# Patient Record
Sex: Male | Born: 1960 | Race: White | Hispanic: No | Marital: Married | Smoking: Never smoker
Health system: Southern US, Community
[De-identification: ages and names within clinical notes are randomized; demographics above are authoritative.]

## PROBLEM LIST (undated history)

## (undated) DIAGNOSIS — M5430 Sciatica, unspecified side: Secondary | ICD-10-CM

## (undated) DIAGNOSIS — I5022 Chronic systolic (congestive) heart failure: Secondary | ICD-10-CM

## (undated) DIAGNOSIS — I119 Hypertensive heart disease without heart failure: Secondary | ICD-10-CM

## (undated) DIAGNOSIS — E782 Mixed hyperlipidemia: Secondary | ICD-10-CM

## (undated) DIAGNOSIS — Z9289 Personal history of other medical treatment: Secondary | ICD-10-CM

## (undated) DIAGNOSIS — I110 Hypertensive heart disease with heart failure: Secondary | ICD-10-CM

## (undated) DIAGNOSIS — G8929 Other chronic pain: Secondary | ICD-10-CM

## (undated) DIAGNOSIS — I2510 Atherosclerotic heart disease of native coronary artery without angina pectoris: Secondary | ICD-10-CM

## (undated) DIAGNOSIS — IMO0002 Reserved for concepts with insufficient information to code with codable children: Secondary | ICD-10-CM

## (undated) DIAGNOSIS — K219 Gastro-esophageal reflux disease without esophagitis: Secondary | ICD-10-CM

## (undated) DIAGNOSIS — M419 Scoliosis, unspecified: Secondary | ICD-10-CM

## (undated) DIAGNOSIS — I200 Unstable angina: Secondary | ICD-10-CM

## (undated) DIAGNOSIS — M545 Low back pain, unspecified: Secondary | ICD-10-CM

## (undated) DIAGNOSIS — E669 Obesity, unspecified: Secondary | ICD-10-CM

## (undated) DIAGNOSIS — G4730 Sleep apnea, unspecified: Secondary | ICD-10-CM

## (undated) HISTORY — PX: LUMBAR DISC SURGERY: SHX700

## (undated) HISTORY — PX: BACK SURGERY: SHX140

## (undated) HISTORY — DX: Sleep apnea, unspecified: G47.30

## (undated) HISTORY — DX: Reserved for concepts with insufficient information to code with codable children: IMO0002

## (undated) HISTORY — DX: Obesity, unspecified: E66.9

## (undated) HISTORY — DX: Hypertensive heart disease without heart failure: I11.9

## (undated) HISTORY — DX: Mixed hyperlipidemia: E78.2

## (undated) HISTORY — DX: Chronic systolic (congestive) heart failure: I50.22

## (undated) HISTORY — PX: CORONARY ANGIOPLASTY WITH STENT PLACEMENT: SHX49

## (undated) HISTORY — DX: Scoliosis, unspecified: M41.9

## (undated) HISTORY — PX: TONSILLECTOMY: SUR1361

## (undated) HISTORY — DX: Hypertensive heart disease with heart failure: I11.0

## (undated) HISTORY — DX: Atherosclerotic heart disease of native coronary artery without angina pectoris: I25.10

## (undated) HISTORY — DX: Unstable angina: I20.0

---
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
## 1998-07-27 ENCOUNTER — Inpatient Hospital Stay (HOSPITAL_COMMUNITY): Admission: AD | Admit: 1998-07-27 | Discharge: 1998-07-29 | Payer: Self-pay | Admitting: Cardiology

## 1999-05-17 ENCOUNTER — Inpatient Hospital Stay (HOSPITAL_COMMUNITY): Admission: EM | Admit: 1999-05-17 | Discharge: 1999-05-19 | Payer: Self-pay | Admitting: *Deleted

## 2001-05-14 ENCOUNTER — Encounter: Admission: RE | Admit: 2001-05-14 | Discharge: 2001-05-14 | Payer: Self-pay | Admitting: Osteopathic Medicine

## 2001-05-14 ENCOUNTER — Encounter: Payer: Self-pay | Admitting: Osteopathic Medicine

## 2001-07-19 ENCOUNTER — Ambulatory Visit (HOSPITAL_COMMUNITY): Admission: RE | Admit: 2001-07-19 | Discharge: 2001-07-19 | Payer: Self-pay | Admitting: Osteopathic Medicine

## 2001-08-08 ENCOUNTER — Encounter: Payer: Self-pay | Admitting: Osteopathic Medicine

## 2001-08-08 ENCOUNTER — Encounter: Admission: RE | Admit: 2001-08-08 | Discharge: 2001-08-08 | Payer: Self-pay | Admitting: Osteopathic Medicine

## 2003-02-21 ENCOUNTER — Ambulatory Visit (HOSPITAL_COMMUNITY): Admission: RE | Admit: 2003-02-21 | Discharge: 2003-02-21 | Payer: Self-pay | Admitting: Cardiology

## 2003-02-21 ENCOUNTER — Inpatient Hospital Stay (HOSPITAL_COMMUNITY): Admission: EM | Admit: 2003-02-21 | Discharge: 2003-02-25 | Payer: Self-pay | Admitting: Cardiology

## 2003-12-16 ENCOUNTER — Ambulatory Visit: Payer: Self-pay | Admitting: Cardiology

## 2004-01-01 ENCOUNTER — Ambulatory Visit (HOSPITAL_BASED_OUTPATIENT_CLINIC_OR_DEPARTMENT_OTHER): Admission: RE | Admit: 2004-01-01 | Discharge: 2004-01-01 | Payer: Self-pay | Admitting: Cardiology

## 2004-01-01 ENCOUNTER — Ambulatory Visit: Payer: Self-pay | Admitting: Pulmonary Disease

## 2004-02-15 HISTORY — PX: CARDIAC CATHETERIZATION: SHX172

## 2004-02-20 ENCOUNTER — Ambulatory Visit: Payer: Self-pay | Admitting: Cardiology

## 2004-03-05 ENCOUNTER — Ambulatory Visit: Payer: Self-pay | Admitting: Internal Medicine

## 2004-03-15 ENCOUNTER — Ambulatory Visit: Payer: Self-pay | Admitting: Internal Medicine

## 2004-03-18 ENCOUNTER — Encounter: Payer: Self-pay | Admitting: Cardiology

## 2004-03-25 ENCOUNTER — Ambulatory Visit (HOSPITAL_BASED_OUTPATIENT_CLINIC_OR_DEPARTMENT_OTHER): Admission: RE | Admit: 2004-03-25 | Discharge: 2004-03-25 | Payer: Self-pay | Admitting: Internal Medicine

## 2004-04-01 ENCOUNTER — Ambulatory Visit: Payer: Self-pay | Admitting: Internal Medicine

## 2004-04-05 ENCOUNTER — Ambulatory Visit: Payer: Self-pay

## 2004-04-15 ENCOUNTER — Ambulatory Visit: Payer: Self-pay | Admitting: Internal Medicine

## 2004-04-22 ENCOUNTER — Ambulatory Visit: Payer: Self-pay | Admitting: Internal Medicine

## 2004-05-13 ENCOUNTER — Ambulatory Visit: Payer: Self-pay | Admitting: Internal Medicine

## 2004-05-31 ENCOUNTER — Ambulatory Visit: Payer: Self-pay | Admitting: Internal Medicine

## 2004-07-13 ENCOUNTER — Ambulatory Visit: Payer: Self-pay | Admitting: Cardiology

## 2004-09-28 ENCOUNTER — Ambulatory Visit: Payer: Self-pay | Admitting: Cardiology

## 2004-11-02 ENCOUNTER — Ambulatory Visit: Payer: Self-pay | Admitting: Cardiology

## 2005-04-22 ENCOUNTER — Inpatient Hospital Stay (HOSPITAL_COMMUNITY): Admission: AD | Admit: 2005-04-22 | Discharge: 2005-04-26 | Payer: Self-pay | Admitting: Cardiology

## 2005-04-22 ENCOUNTER — Ambulatory Visit: Payer: Self-pay | Admitting: Cardiology

## 2005-05-09 ENCOUNTER — Ambulatory Visit: Payer: Self-pay | Admitting: Internal Medicine

## 2005-08-05 ENCOUNTER — Ambulatory Visit: Payer: Self-pay | Admitting: Cardiology

## 2005-11-09 ENCOUNTER — Ambulatory Visit: Payer: Self-pay | Admitting: Cardiology

## 2006-01-26 ENCOUNTER — Ambulatory Visit: Payer: Self-pay | Admitting: Cardiology

## 2006-10-12 ENCOUNTER — Ambulatory Visit: Payer: Self-pay | Admitting: Cardiology

## 2007-12-28 ENCOUNTER — Ambulatory Visit: Payer: Self-pay | Admitting: Cardiology

## 2007-12-28 LAB — CONVERTED CEMR LAB
ALT: 18 units/L (ref 0–53)
AST: 21 units/L (ref 0–37)
Albumin: 3.8 g/dL (ref 3.5–5.2)
Alkaline Phosphatase: 43 units/L (ref 39–117)
Bilirubin, Direct: 0.1 mg/dL (ref 0.0–0.3)
Cholesterol: 183 mg/dL (ref 0–200)
HDL: 23.7 mg/dL — ABNORMAL LOW (ref >39.0)
LDL Cholesterol: 128 mg/dL — ABNORMAL HIGH (ref 0–99)
Total Bilirubin: 0.7 mg/dL (ref 0.3–1.2)
Total CHOL/HDL Ratio: 7.7
Total Protein: 7.2 g/dL (ref 6.0–8.3)
Triglycerides: 155 mg/dL — ABNORMAL HIGH (ref 0–149)
VLDL: 31 mg/dL (ref 0–40)

## 2008-10-14 ENCOUNTER — Encounter (INDEPENDENT_AMBULATORY_CARE_PROVIDER_SITE_OTHER): Payer: Self-pay | Admitting: *Deleted

## 2009-05-19 ENCOUNTER — Encounter: Payer: Self-pay | Admitting: Cardiology

## 2009-08-28 DIAGNOSIS — E785 Hyperlipidemia, unspecified: Secondary | ICD-10-CM

## 2009-08-28 DIAGNOSIS — E669 Obesity, unspecified: Secondary | ICD-10-CM

## 2009-08-28 DIAGNOSIS — I10 Essential (primary) hypertension: Secondary | ICD-10-CM

## 2009-08-28 HISTORY — DX: Obesity, unspecified: E66.9

## 2009-08-28 HISTORY — DX: Essential (primary) hypertension: I10

## 2009-08-28 HISTORY — DX: Hyperlipidemia, unspecified: E78.5

## 2009-09-02 ENCOUNTER — Ambulatory Visit: Payer: Self-pay | Admitting: Cardiology

## 2009-11-25 ENCOUNTER — Ambulatory Visit: Payer: Self-pay | Admitting: Cardiology

## 2010-03-09 ENCOUNTER — Encounter: Payer: Self-pay | Admitting: Cardiology

## 2010-03-16 NOTE — Assessment & Plan Note (Signed)
Summary: f1y  Medications Added CRESTOR 10 MG TABS (ROSUVASTATIN CALCIUM) 1 by mouth dialy CITALOPRAM HYDROBROMIDE 40 MG TABS (CITALOPRAM HYDROBROMIDE) 1 by mouth daily LANSOPRAZOLE 30 MG CPDR (LANSOPRAZOLE) 1 by mouth dialy NITROSTAT 0.4 MG SUBL (NITROGLYCERIN) as needed for chest pain PERCOCET 2.5-325 MG TABS (OXYCODONE-ACETAMINOPHEN) as needed      Allergies Added: NKDA  Visit Type:  Follow-up Primary Provider:  Dr. Gwendlyn Deutscher  CC:  CAD .  History of Present Illness: The patient presents for followup. It has been about a year and a half. Since I last saw him he has had no new cardiovascular complaints. He does do walking most days of the week. With this he does not get any chest pressure, neck or arm discomfort. He has no palpitations, presyncope or syncope. He is not describing any shortness of breath, PND or orthopnea. His lipids are followed by his primary care and he says he's not yet at target but is having his meds adjusted.  Current Medications (verified): 1)  Enalapril Maleate 20 Mg Tabs (Enalapril Maleate) .... One Tab Two Times A Day 2)  Plavix 75 Mg Tabs (Clopidogrel Bisulfate) .Marland Kitchen.. 1 Tab Once Daily 3)  Fenofibrate Micronized 200 Mg Caps (Fenofibrate Micronized) .Marland Kitchen.. 1 By Mouth Daily 4)  Crestor 10 Mg Tabs (Rosuvastatin Calcium) .Marland Kitchen.. 1 By Mouth Dialy 5)  Metoprolol Succinate 200 Mg Xr24h-Tab (Metoprolol Succinate) .Marland Kitchen.. 1 By Mouth Daily 6)  Citalopram Hydrobromide 40 Mg Tabs (Citalopram Hydrobromide) .Marland Kitchen.. 1 By Mouth Daily 7)  Lansoprazole 30 Mg Cpdr (Lansoprazole) .Marland Kitchen.. 1 By Mouth Dialy 8)  Nitrostat 0.4 Mg Subl (Nitroglycerin) .... As Needed For Chest Pain 9)  Percocet 2.5-325 Mg Tabs (Oxycodone-Acetaminophen) .... As Needed  Allergies (verified): No Known Drug Allergies  Past History:  Past Medical History: Reviewed history from 08/28/2009 and no changes required.  Coronary artery disease (previous severe   cardiomyopathy with remote myocardial infarction and  bare-metal stenting   of the right coronary artery in 2000.  Catheterization in 2007   demonstrated a 50-70% narrowing in the ostium of the diagonal off of the   LAD, 40% narrowing of the circumflex marginal and the proximal area the   previously placed stent and 0% stenosis in the stent.  He had a 95%   stenosis in the right coronary artery.  His EF was 55%.  He underwent   Cypher stenting in the stenosed area.), cardiomyopathy (the patient's   ejection fraction by most recent cath was 50%.  It had been 35-40% in   the past), hypertension, hyperlipidemia, mild sleep apnea not requiring   treatment, obesity, scoliosis.      Past Surgical History: Reviewed history from 08/28/2009 and no changes required.  1. Cardiac catheterization.  2. Back surgery x2.  Review of Systems       As stated in the HPI and negative for all other systems.   Vital Signs:  Patient profile:   50 year old male Height:      64 inches Weight:      175 pounds BMI:     30.15 Pulse rate:   62 / minute Resp:     18 per minute BP sitting:   122 / 86  (right arm)  Vitals Entered By: Marrion Coy, CNA (September 02, 2009 9:56 AM)  Nutrition Counseling: Patient's BMI is greater than 25 and therefore counseled on weight management options.  Physical Exam  General:  Well developed, well nourished, in no acute distress. Head:  normocephalic and  atraumatic Eyes:  PERRLA/EOM intact; conjunctiva and lids normal. Mouth:  poor dentition.  poor dentition.   Neck:  Neck supple, no JVD. No masses, thyromegaly or abnormal cervical nodes. Chest Wall:  no deformities or breast masses noted Lungs:  Clear bilaterally to auscultation and percussion. Abdomen:  Bowel sounds positive; abdomen soft and non-tender without masses, organomegaly, or hernias noted. No hepatosplenomegaly. Msk:  Back normal, normal gait. Muscle strength and tone normal. Extremities:  No clubbing or cyanosis. Neurologic:  Alert and oriented x 3. Skin:   Intact without lesions or rashes. Cervical Nodes:  no significant adenopathy Psych:  Normal affect.   Detailed Cardiovascular Exam  Neck    Carotids: Carotids full and equal bilaterally without bruits.      Neck Veins: Normal, no JVD.    Heart    Inspection: no deformities or lifts noted.      Palpation: normal PMI with no thrills palpable.      Auscultation: regular rate and rhythm, S1, S2 without murmurs, rubs, gallops, or clicks.    Vascular    Abdominal Aorta: no palpable masses, pulsations, or audible bruits.      Femoral Pulses: normal femoral pulses bilaterally.      Pedal Pulses: normal pedal pulses bilaterally.      Radial Pulses: normal radial pulses bilaterally.      Peripheral Circulation: no clubbing, cyanosis, or edema noted with normal capillary refill.     EKG  Procedure date:  09/02/2009  Findings:      Sinus rhythm, rate 62, axis within normal limits, intervals within normal limits, nonspecific lateral T wave flattening  Impression & Recommendations:  Problem # 1:  CAD (ICD-414.00) It has been 4 years since his last catheterization. According to appropriateness guidelines it is time for an exercise treadmill test. This will allow me to rule out obstructive disease, risk stratify and give him a prescription for exercise. Orders: EKG w/ Interpretation (93000) Treadmill (Treadmill)  Problem # 2:  OBESITY (ICD-278.00) We discussed weight loss and diet strategies.  Problem # 3:  HYPERTENSION (ICD-401.9) His blood pressure is controlled and he will continue as above.  Problem # 4:  HYPERLIPIDEMIA (ICD-272.4) Lipids are being followed with his primary care. The goal would be LDL less than 100 and HDL greater than 40. An aggressive goal given his young age would be appropriate with an LDL less than 70.  Patient Instructions: 1)  Your physician recommends that you schedule a follow-up appointment at time of treadmill 2)  Your physician recommends that you  continue on your current medications as directed. Please refer to the Current Medication list given to you today. 3)  Your physician has requested that you have an exercise tolerance test.  For further information please visit [email or web address]  Please also follow instruction sheet, as given.

## 2010-03-16 NOTE — Letter (Signed)
Summary: [facility] Office Note  [facility] Office Note   Imported By: Roderic Ovens 05/29/2009 15:05:56  _____________________________________________________________________  External Attachment:    Type:   Image     Comment:   External Document

## 2010-03-23 ENCOUNTER — Encounter: Payer: Self-pay | Admitting: Cardiology

## 2010-04-01 NOTE — Miscellaneous (Signed)
Summary: crestor 40 mg   Clinical Lists Changes  Medications: Changed medication from CRESTOR 10 MG TABS (ROSUVASTATIN CALCIUM) 1 by mouth dialy to CRESTOR 40 MG TABS (ROSUVASTATIN CALCIUM) one daily - Signed Rx of CRESTOR 40 MG TABS (ROSUVASTATIN CALCIUM) one daily;  #30 x 11;  Signed;  Entered by: Charolotte Capuchin, RN;  Authorized by: Rollene Rotunda, MD, [facility];  Method used: Electronically to [facility]  [id number] [place], [place]  [address], Ph: [phone] or [phone], Fax: [phone]    Prescriptions: CRESTOR 40 MG TABS (ROSUVASTATIN CALCIUM) one daily  #30 x 11   Entered by:   Charolotte Capuchin, RN   Authorized by:   Rollene Rotunda, MD, [facility]   Signed by:   Charolotte Capuchin, RN on 03/23/2010   Method used:   [facility] to        [facility]  [id number] (retail)       [address]       [place], [place]  [address]       Ph: [phone] or [phone]       Fax: [phone]   RxID:   [id number]

## 2010-05-28 ENCOUNTER — Encounter: Payer: Self-pay | Admitting: Cardiology

## 2010-05-28 ENCOUNTER — Other Ambulatory Visit: Payer: Self-pay | Admitting: Nurse Practitioner

## 2010-05-28 DIAGNOSIS — I2510 Atherosclerotic heart disease of native coronary artery without angina pectoris: Secondary | ICD-10-CM

## 2010-06-29 NOTE — Assessment & Plan Note (Signed)
[facility]                            CARDIOLOGY OFFICE NOTE   NAME:[name], [name]                          MRN:          [id number]  DATE:10/12/2006                            DOB:          09/01/1960    PRIMARY:  Dr. Gwendlyn Deutscher.   REASON FOR PRESENTATION:  Evaluate patient with coronary disease.   HISTORY OF PRESENT ILLNESS:  The patient is a 50 year old   INCOMPLETE DICTATION.     Rollene Rotunda, MD, [facility]     JH/MedQ  DD: 10/12/2006  DT: 10/13/2006  Job #: [id number]

## 2010-06-29 NOTE — Assessment & Plan Note (Signed)
[facility]                            CARDIOLOGY OFFICE NOTE   NAME:[name] [name], [name] [name]                          MRN:          [id number]  DATE:10/12/2006                            DOB:          12-04-1960    PRIMARY CARE PHYSICIAN:  Gwendlyn Deutscher, M.D.   REASON FOR PRESENTATION:  Evaluate patient with coronary disease.   HISTORY OF PRESENT ILLNESS:  The patient returns for followup of the  above. He has done relatively well since I last saw him. He has had no  further chest discomfort since his angioplasty in 2007. He denies any  neck or arm discomfort. He has had no palpitation, pre-syncope, or  syncope. He has no shortness of breath, PND, or orthopnea. He has not  been exercising as routinely as I would like, though he does do some  things such as carrying items up a hill and paddling a canoe, which does  not bring on any of his symptoms. He says he is still having some  difficulty sleeping.   PAST MEDICAL HISTORY:  1. Coronary artery disease (previous severe cardiomyopathy with remote      myocardial infarction and [employer] metal stenting to the right coronary      artery in 2000.) Repeat catheterization in 2007 demonstrated a 50%      to 70% narrowing in the ostium of the diagonal of the LAD, 40%      narrowing of the circumflex marginal at the proximal area of the      previously placed stent and O% stenosis in the stent. The patient      had 95% stenosis in the right coronary artery. He had an ejection      fraction of 55%. He underwent Cypher stenting to the stenosed area.  2. Hypertension.  3. Hyperlipidemia.  4. Sleep apnea.  5. Scoliosis.   ALLERGIES:  Fenofibrate  NO KNOWN DRUG ALLERGIES.   CURRENT MEDICATIONS:  Toprol 200 mg daily, Enalapril 20 mg b.i.d., Lasix  20 mg daily, Prevacid 30 mg daily, potassium 30 meq b.i.d., Plavix 75 mg  daily, Fenofibrate 54 mg daily, Lipitor 40 mg q.h.s., aspirin 162 mg  daily.   REVIEW OF SYSTEMS:  As  stated in the HPI and otherwise, negative for  other systems.   PHYSICAL EXAMINATION:  GENERAL:  The patient is in no distress.  VITAL SIGNS:  Blood pressure 128/86, heart rate 81 and regular.  HEENT:  Eyelids unremarkable. Pupils are equal, round, and reactive to  light. Fundi visualized. Oral mucosa unremarkable.  NECK:  No jugular venous distention. Wave form within normal limits.  Carotid upstroke brisk and symmetric. No bruits, no thyromegaly.  LYMPH:  No cervical, axillary, inguinal adenopathy.  LUNGS:  Clear to auscultation bilaterally.  BACK:  No costovertebral angle tenderness.  CHEST:  Unremarkable.  HEART:  PMI not displaced or sustained. S1 and S2 within normal limits.  No S3, S4, click, rubs, or murmurs.  ABDOMEN:  Obese. Positive bowel sounds. Normal in frequency and pitch.  No bruits, rebound, guarding, midline pulsatile mass, or  hepatosplenomegaly.  SKIN:  No rashes.  EXTREMITIES:  2+ pulses. No edema.   LABORATORY DATA:  EKG:  Sinus rhythm. Rate 81, axis within normal  limits. Intervals within normal limits. No acute STT wave changes.   ASSESSMENT/PLAN:  1. Coronary disease. The patient is having no symptoms related to      this. No further cardiovascular testing is suggested. I did suggest      that he needs more exercise for secondary risk reduction.  2. Insomnia. The patient is bothered by this. He is not wearing his      CPAP. I told him that he needs to go back and see Dr. Maple Hudson. I did      go ahead and give him 5 mg of Ambien for 2 weeks. If he needs      anymore of this, I am going to ask him to have discussion with his      primary care physician.  3. Hypertension. Blood pressure is currently well controlled. He will      continue medications as listed.  4. Obesity. He needs to lose his weight through diet and exercise and      we discussed this.  5. Dyslipidemia. There was some difficulty in his pharmacy getting      Fenofibrate 160 mg. Therefore, I am  going to write for the 200 mg      Fenofibrate. He is going to remain on Lipitor 40. He needs to get a      liver profile and lipids done in about 8 weeks.     Rollene Rotunda, MD, [facility]  Electronically Signed    JH/MedQ  DD: 10/13/2006  DT: 10/14/2006  Job #: [id number]   cc:   Durenda Hurt, M.D.

## 2010-06-29 NOTE — Assessment & Plan Note (Signed)
[facility]                            CARDIOLOGY OFFICE NOTE   NAME:[name] [name], [name] [name]                          MRN:          [id number]  DATE:12/28/2007                            DOB:          February 21, 1960    PRIMARY CARE PHYSICIAN:  Gwendlyn Deutscher, MD   REASON FOR PRESENTATION:  Evaluate the patient with coronary artery  disease.   HISTORY OF PRESENT ILLNESS:  The patient returns for yearly followup  with his coronary artery disease and cardiomyopathy.  Since I last saw  him, he has done well from a cardiovascular standpoint.  He has had no  chest discomfort, neck, or arm discomfort.  She has had no palpitations,  presyncope, or syncope.  He tries to exercise a couple times a week, but  he is not very religious about this.  He actually has gained weight.  He  has not had his lipids checked in a while when he comes fasting today.  He is taking the medications as listed.   PAST MEDICAL HISTORY:  Coronary artery disease (previous severe  cardiomyopathy with remote myocardial infarction and bare-metal stenting  of the right coronary artery in 2000.  Catheterization in 2007  demonstrated a 50-70% narrowing in the ostium of the diagonal off of the  LAD, 40% narrowing of the circumflex marginal and the proximal area the  previously placed stent and 0% stenosis in the stent.  He had a 95%  stenosis in the right coronary artery.  His EF was 55%.  He underwent  Cypher stenting in the stenosed area.), cardiomyopathy (the patient's  ejection fraction by most recent cath was 50%.  It had been 35-40% in  the past), hypertension, hyperlipidemia, mild sleep apnea not requiring  treatment, obesity, scoliosis.   MEDICATIONS:  1. Toprol 200 mg daily.  2. Enalapril 20 mg b.i.d.  3. Lasix 20 mg daily.  4. Prevacid 30 mg daily.  5. Potassium 20 mEq b.i.d.  6. Plavix 75 mg daily.  7. Fenofibrate 200 mg daily.  8. Lipitor 40 mg daily.  9. Aspirin 162 mg daily.   REVIEW  OF SYSTEMS:  As stated in the HPI and otherwise negative for  other systems.   PHYSICAL EXAMINATION:  GENERAL:  The patient is pleasant and in no  distress.  VITAL SIGNS:  Blood pressure 116/82, heart rate 59 and regular, weight  171 pounds.  HEENT:  Eyelids are unremarkable.  Pupils are equal, round, and reactive  to light.  Fundi not visualized.  Oral mucosa unremarkable.  NECK:  No jugular venous distention to 45 degrees.  Carotid upstroke  brisk and symmetrical.  No bruits.  No thyromegaly.  LYMPHATICS:  No cervical, axillary, or inguinal adenopathy.  LUNGS:  Clear to auscultation bilaterally.  BACK:  No costovertebral angle tenderness.  CHEST:  Unremarkable.  HEART:  PMI not displaced or sustained.  S1 and S2 within normal limits.  No S3.  No S4.  No clicks.  No rubs.  No murmurs.  ABDOMEN:  Obese.  Positive bowel sounds normal in frequency and pitch.  No bruits.  No rebound.  No guarding.  No midline pulsatile mass.  No  hepatomegaly.  No splenomegaly.  SKIN:  No rashes.  No nodules.  EXTREMITIES:  Pulse 2+ throughout.  No edema, no cyanosis, no clubbing.  NEUROLOGIC:  Oriented to person, place, and time.  Cranial nerves II-XII  grossly intact.  Motor grossly intact.   EKG sinus rhythm, rate 59, axis within normal limits, intervals within  normal limits, no acute ST-wave changes.   ASSESSMENT AND PLAN:  1. Coronary artery disease.  The patient is having no new symptoms.      No further cardiovascular testing is suggested.  We are going to      continue aggressive risk reduction.  2. Dyslipidemia.  We will check a lipid profile today and manages as      indicated.  3. Obesity.  We discussed the need to lose weight with diet and      exercise.  He is not exercising enough and I gave him some specific      instructions on this.  4. Cardiomyopathy.  His ejection fraction had improved and I have no      reason to reevaluate.  He will continue on meds as listed.  5. Followup.   We will see him back in 1 year or sooner if needed.     Rollene Rotunda, MD, [facility]  Electronically Signed    JH/MedQ  DD: 12/28/2007  DT: 12/28/2007  Job #: [id number]   cc:   Durenda Hurt, M.D.

## 2010-07-02 NOTE — H&P (Signed)
[name] [name], [name] NO.:  [id number]   MEDICAL RECORD NO.:  [id number]                   PATIENT TYPE:  INP   LOCATION:  2922                                 FACILITY:  [facility]   PHYSICIAN:  Rollene Rotunda, M.D.                DATE OF BIRTH:  [birth year]   DATE OF ADMISSION:  02/21/2003  DATE OF DISCHARGE:                                HISTORY & PHYSICAL   PRIMARY PHYSICIAN:  Dr. Jeanie Sewer in [place], [place].   PRIMARY CARDIOLOGIST:  Dr. Chales Abrahams.   CHIEF COMPLAINT:  Chest pain, shortness of breath.   HISTORY OF PRESENT ILLNESS:  [name] is a 50 year old male with known  coronary artery disease who went to [facility] at about 2 a.m. on the  day of admission for chest pain.  He was treated there with aspirin,  heparin, and IV fluids for decreased blood pressure in the 70s.  He also  received Integrilin.  The patient was felt to be acutely abnormal and was  transported to [facility] for further evaluation and urgent  catheterization.   [name] describes his pain as less than 5/10 at its worst, but he has a  near-syncopal episode as well as the chest pain, which was associated with  shortness of breath and nausea.  The near-syncopal episode is the main  reason he went to the emergency room.  He received IV Lasix at [facility], a  40 mg dose, and his shortness of breath improved.  He describes an  approximately two-week history of PND and orthopnea and denies lower  extremity edema.  He also states that he had a viral illness that was  resolving over the last two weeks as well.  He did not see his family  physician during this illness.   PAST MEDICAL HISTORY:  1. History of a stent to his RCA in June, 2000 and a stent to his OM in     April, 2001.  2. Hypertension.  3. Hyperlipidemia.  4. Obesity.  5. Family history of coronary artery disease.  6. He denies any history of tobacco use.  7. He has a history of  gastroesophageal reflux disease symptoms.   PAST SURGICAL HISTORY:  1. Cardiac catheterization.  2. Back surgery x2.   ALLERGIES:  No known drug allergies.   MEDICATIONS PRIOR TO ADMISSION:  1. Aspirin 325 mg q.d.  2. Norvasc 5 mg q.d.  3. Toprol XL, unclear dose, probably 25 mg q.d.  4. Altace 2.5 mg q.d.  5. Lipitor 40 mg q.d.  6. ___________ p.r.n.   SOCIAL HISTORY:  Lives in [place] with his wife.  Works at [employer].  He has never smoked.  He drinks beer, but he states that he has no more than  two six weeks per week and some weeks does not drink at all.  He denies drug  abuse.   FAMILY HISTORY:  Significant for his mother is alive without any history of  coronary artery disease, but his father had the first MI at age 61, which  made him bedridden, although he did not die until age 71.  He has no  siblings with coronary artery disease.   REVIEW OF SYSTEMS:  Significant for a viral illness that he states was  resolving over the last 3-4 weeks, for which he did not see his primary care  physician.  He had a presyncopal episode today as well as orthopnea and PND  but denies any edema or palpitations.  He has arthralgias with pain that is  chronic to his back and denies any melena, hematemesis, hemoptysis, or  reflux symptoms.  Review of systems is otherwise negative.   PHYSICAL EXAMINATION:  VITAL SIGNS:  He is afebrile.  Blood pressure 113/56.  His heart rate is 106 and regular, sinus tachycardia on the monitor.  His  respiratory rate is 22 and not labored.  GENERAL:  He is a well-developed and well-nourished white male in no acute  distress.  HEENT:  Head is normocephalic and atraumatic.  Pupils are equal, round and  reactive to light and accommodation.  Nares without discharge.  NECK:  Supple without lymphadenopathy, thyromegaly, bruits, or JVD.  CV:  Heart is regular in rate and rhythm with an S1 and S2 and a 2-3/6  systolic ejection murmur at the left sternal  border.  LUNGS:  A few rales in the bases, but no crackles are appreciated.  SKIN:  No rashes or lesions are noted.  ABDOMEN:  Soft and nontender with active bowel sounds.  No  hepatosplenomegaly noted.  EXTREMITIES:  There is no clubbing, cyanosis or edema noted.  MUSCULOSKELETAL:  He has no joint deformity or effusions.  No spinal or CVA  tenderness.  NEUROLOGIC:  He is alert and oriented x 3 with cranial nerves II-XII are  grossly intact.   CHEST X-RAY:  Results pending.   EKG:  Sinus rhythm without acute ischemic changes.   LABORATORY VALUES:  Hemoglobin 15.1, hematocrit 44.6, WBC 9.8, platelets  283.  Sodium 139, potassium 3.8, chloride 100, CO2 27, BUN 19, creatinine  1.2, glucose 179.  CK total 58.  Troponin I 0.1.  INR 1.0.  PTT 28.   ASSESSMENT/PLAN:  1. Chest pain with hypotension treated with intravenous fluids at [facility].  Urgent catheterization, which he had today, showed no critical     disease and no significant end-stent restenosis, so PCI was not needed.  2. 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  His     ejection fraction is 10%, which is significantly decreased from an     ejection fraction of 50% at a Cardiolite performed in July, 2003.  He has     global hypokinesis.  The MD is to evaluate and advise if this is possibly     viral in origin with his recent illness and also when he should have a     follow-up echo.  3. Congestive heart failure:  He had good urine output after IV Lasix.  His     respiratory status improved, and his oxygen saturation is good on 2     liters.  We will follow him for this and continue dobutamine for now and  possibly discontinuing the dobutamine tomorrow if he continues to do     well.  4. Hyperlipidemia:  Check lipids in the a.m., as he has not had a recent     check. 5. History of gastroesophageal reflux disease  symptoms, which he now denies.     Besides continuing him on aspirin, will add a proton pump inhibitor to     his medication regimen.  6. Hyperglycemia with a blood sugar of 179 on labs performed on [facility].  Will check a hemoglobin A1C and follow CBGs as well.   Dr. Antoine Poche and I evaluated the patient and felt that dobutamine was  indicated and that he might tolerate a slight volume for BP, but the EDP is  very high.  Hopefully, we will be able to restart his ACE inhibitor before  discharge.  There is possible role for transfer to transplant center, as he  has class II symptoms.   Dr. Antoine Poche and Dr. Charlies Constable both saw the patient and determined the  plan of care.      Theodore Demark, P.A. [facility]                  Rollene Rotunda, M.D.    RB/MEDQ  D:  02/21/2003  T:  02/21/2003  Job:  [id number]

## 2010-07-02 NOTE — Discharge Summary (Signed)
NAMEGENARO, BEKKER NO.:  [id number]   MEDICAL RECORD NO.:  [id number]                   PATIENT TYPE:  INP   LOCATION:  2005                                 FACILITY:  [facility]   PHYSICIAN:  Rollene Rotunda, M.D.                DATE OF BIRTH:  03-27-1960   DATE OF ADMISSION:  02/21/2003  DATE OF DISCHARGE:  02/25/2003                           DISCHARGE SUMMARY - REFERRING   PROCEDURES:  Cardiac catheterization, February 21, 2003.   REASON FOR ADMISSION:  Please refer to dictated admission note.   LABORATORY DATA:  Sodium 137, potassium 4.2, glucose 116, BUN 18, creatinine  1.1 at discharge.  BNP 388 at discharge.  Serial cardiac markers:  Normal  CPK/MB; marginally elevated troponin I (peak 0.11).  Lipid profile:  Total  cholesterol 327, triglycerides 224, HDL 38, LDL 144 (cholesterol/HDL ratio  6.0).  Hemoglobin A1c 5.5.   HOSPITAL COURSE:  Following transfer from [facility], where the  patient initially presented with presyncope, the patient was taken directly  to the catheterization lab for emergent coronary angiography.  He was  hypotensive on presentation with systolic reading in the 80s.   Coronary angiogram, however, revealed severe, acute nonischemic  cardiomyopathy with global hypokinesis and estimated ejection fraction of  10%.   Of note, there was no in-stent restenosis at the previous stent/CFX site and  less than 20% in-stent restenosis of the proximal RCA.  Residual notable for  a 70% diagonal and a 40% mid, distal RCA.   Postop course notable for treatment of cardiomyopathy, initially with  dobutamine.  The patient was followed closely by Dr. Rollene Rotunda, who  felt that there was no role for Natrecor.  He also felt that the patient was  not suitable for cardiac transplant, given that he had class II symptoms.   The patient gradually progressed and remained stable on oral medications.  He continued to report no further  chest pain or shortness of breath and was  felt to be compensated on hospital day #4, and thus cleared for discharge.   Notable labs at time of discharge include potassium 4.2, creatinine 1.1, and  BNP level 388.   Of note, the patient will need a followup 2-D echocardiogram in 6 weeks.   Discharge weight 149.5 (155 on admission).   MEDICATIONS AT DISCHARGE:  1. Vasotec 2.5 mg b.i.d. (new).  2. Lasix 40 mg daily (new).  3. K-Dur 20 mEq daily (new).  4. Digoxin 0.25 mg daily (new).  5. Toprol-XL 50 mg daily.  6. Lipitor 40 mg nightly.  7. Baby aspirin 81 mg daily.   INSTRUCTIONS:  Refrain from any added salt.  Check daily weights.   The patient will need a followup BMET in 1 week at [facility].   The patient is scheduled to follow up with Dr. Veneda Melter on Wednesday,  March 05, 2003, at  4:15 p.m.  He will need a repeat 2-D echocardiogram in  6 weeks.   DISCHARGE DIAGNOSES:  1. Severe acute nonischemic cardiomyopathy.     a. Status post emergent coronary angiogram, February 21, 2003.     b. Severe left ventricular dysfunction (ejection fraction approximately        10% with global hypokinesis).     c. Status post non-ST myocardial infarction/stent, circumflex, 2001;        status post stent to right coronary artery, 2000.  2. History of hypertension.  3. Mixed dyslipidemia.  4. Hyperglycemia.     a. Normal hemoglobin A1c.  5. Hypokalemia.      Gene Serpe, P.A. [facility]                      Rollene Rotunda, M.D.    GS/MEDQ  D:  02/25/2003  T:  02/25/2003  Job:  [id number]   cc:   Durenda Hurt, M.D.  [address]  [place], [place] [address]  Fax: [phone]

## 2010-07-02 NOTE — Procedures (Signed)
[name] [name], [name] [name]                 ACCOUNT NO.:  [id number]   MEDICAL RECORD NO.:  [id number]          PATIENT TYPE:  OUT   LOCATION:  SLEEP CENTER                 FACILITY:  [facility]   PHYSICIAN:  Clinton D. Maple Hudson, M.D. DATE OF BIRTH:  May 04, 1960   DATE OF STUDY:  03/25/2004                              NOCTURNAL POLYSOMNOGRAM   STUDY DATE:  March 25, 2004   REFERRING PHYSICIAN:  Dr. Jetty Duhamel   INDICATION FOR STUDY:  Hypersomnia with obstructive sleep apnea.  Epworth  Sleepiness Score 4/24, BMI 27, weight 160 pounds.  Previous NPSG January 01, 2004 limited by short sleep time with RDI 4 per hour.   SLEEP ARCHITECTURE:  Total sleep time 469 minutes with sleep efficiency 97%.  Stage I was 2%, stage II was 57%, stages III and IV 24%, REM was 17% of  total sleep time.  Sleep latency 2 minutes, REM latency 160 minutes, awake  after sleep onset 11 minutes, arousal index 12.  No sleep medications taken.   RESPIRATORY DATA:  Split-study protocol.  Respiratory disturbance index  (RDI) 13.4 obstructive events per hour indicating mild obstructive sleep  apnea/hypopnea syndrome before CPAP.  This included 3 obstructive apneas and  38 hypopneas before CPAP.  Initial sleep and most events were supine.  REM  RDI 9.6 per hour.  CPAP was titrated to 6 CWP, RDI 0.9 per hour using a  small Respironics ComfortSelect Nasal Mask with heated humidifier which was  well tolerated.   OXYGEN DATA:  Moderate snoring with oxygen desaturation to a nadir of 86%  before CPAP.  After CPAP titration oxygen saturation held 95-98% on room  air.   CARDIAC DATA:  Normal sinus rhythm.   MOVEMENT/PARASOMNIA:  A total of 127 limb jerks were recorded of which 14  were associated with arousal or awakening for a periodic limb movement with  arousal index of 1.8 per hour which is slightly increased and probably  clinically insignificant.   IMPRESSION/RECOMMENDATION:  1.  Mild obstructive sleep apnea/hypopnea  syndrome, RDI 13.4 per hour with      oxygen desaturation to 86%.  2.  Successful continuous positive airway pressure titration to 6 CWP, RDI      0.9 per hour using a small Respironics      ComfortSelect Nasal Mask with heated humidifier.  3.  Mild periodic limb movement with arousal.      CDY/MEDQ  D:  04/04/2004 11:53:35  T:  04/04/2004 22:06:08  Job:  [id number]

## 2010-07-02 NOTE — Discharge Summary (Signed)
[place]  Patient:    [name] [name], [name] [name]                          MRN: [id number] Adm. Date:  05/17/99 Disc. Date: 05/19/99 Attending:  Noralyn Pick. Eden Emms, M.D. [facility] Dictator:   Rozell Searing, P.A. CC:         Gwendlyn Deutscher, M.D.                           Discharge Summary  PROCEDURES:  Cardiac catheterization/stent OM April 3.  REASON FOR ADMISSION:  Mr. Schreck is a 50 year old male patient of Dr. Nedra Hai with previous stent/RCA in June 2000 who presented with symptoms suggestive of unstable angina pectoris.  He was admitted for rule out of MI and further diagnostic evaluation.  LABORATORY DATA:  Cardiac Enzymes: Peak CPK 344/46 (13.4%); peak troponin I 1.02.  Lipid profile pending.  CBC normal at discharge.  Potassium 3.3 on admission, 3.5 at discharge.  Magnesium 2.0.  Normal renal function.  Admission Chest X-ray: No acute distress.  HOSPITAL COURSE:  Following admission, the patient ruled in for non-Q-wave myocardial infarction.  He was placed on Lovenox and plans were to proceed with definitive coronary angiogram to rule out restenosis of previous stent of the RCA.  Cardiac catheterization performed by Dr. Nedra Hai (see catheterization report) revealed no significant restenosis of prior stent at RCA site (approximately 20%) with subsequent 30% mid and distal lesion; no significant LAD disease; 50% ostial ______ ; 95% proximal OM-1 lesion.  Left ventriculogram: EF greater than 55%; no MR.  Dr. Chales Abrahams proceeded with successful PTCA and predilatation followed by stenting of the 95% OM-1 lesion to 0% residual stenosis.  No complications were noted, and patient was cleared for discharge the following day following completion of his Integrilin infusion.  Of note, potassium 3.5 at discharge, received supplemental dose.  The patient is on Diovan 160/HCTZ 12.5.  Medication Adjustments: Plavix 75 mg q.d (x 4 weeks).  DISCHARGE MEDICATIONS:  1. Plavix  75 mg q.d. (x 4 weeks).  2. Coated aspirin 325 mg q.d.  3. Lipitor 40 mg q.d.  4. Niaspan 2 g q.d.  5. Prevacid 30 mg q.d.  6. Norvasc 5 mg q.d.  7. Diovan 160/12.5 mg q.d.  8. Paxil 20 mg q.d.  9. Darvocet-N 100 as directed. 10. Nitrostat as directed.  INSTRUCTIONS:  The patient is to refrain from any heavy lifting, strenuous activity, or driving x 2 weeks.  He is to maintain low-fat/cholesterol diet. He is instructed to call our office if there is any bleeding/swelling in the groin.  The patient will follow up with Gene Serpe, P.A.-C. in two weeks.  He will then return for followup with Dr. Nedra Hai in approximately three months.  DISCHARGE DIAGNOSES:  1. Acute coronary syndrome/non-Q-wave myocardial infarction.     a. Percutaneous transluminal coronary angioplasty/stent obtuse margin #1        April 3.     b. A 20% restenosis of stent to right coronary artery site.     c. Normal left ventricular function.     d. Status post stent right coronary artery June 2000 (PRESTO).  2. Dyslipidemia.  3. Hypertension.  4. Gastroesophageal reflux disease.  5. Obesity.  6. hypokalemia.  7. History of sinus bradycardia/significant sinus pause. DD:  05/19/99 TD:  05/19/99 Job: [id number]

## 2010-07-02 NOTE — H&P (Signed)
NAMEPONCIANO, SHEALY NO.:  [id number]   MEDICAL RECORD NO.:  [id number]          PATIENT TYPE:  INP   LOCATION:  4730                         FACILITY:  [facility]   PHYSICIAN:  [name] [name], M.D.   DATE OF BIRTH:  1960/08/12   DATE OF ADMISSION:  04/22/2005  DATE OF DISCHARGE:                                HISTORY & PHYSICAL   PRIMARY CARE:  [name] [name]   CARDIOLOGY:  [name] [name]   PULMONARY:  [name] [name]   CHIEF COMPLAINT:  [name] [name] is a 50 year old Caucasian male with known  coronary artery disease, left ventricular dysfunction due to nonischemic  cardiomyopathy following a viral infection status post recent admission for  congestive heart failure in 2005. [name] [name] presents to the congestive heart  failure clinic on day of admission at [facility] complaining of  exertional angina. [name] [name] has onset approximately 1 week ago with  increased episodes of exertional angina relieved with rest. [name] [name] has  known history of coronary artery disease status post stenting in 2000 for  right coronary artery for a 98% proximal stenosis. The patient has recurrent  chest pain in 2001, was found to have a 95% obtuse marginal stenosis which  was treated with a stent. He also has a known history of hypertension,  recorded ejection fraction of 35%. He also has a history of obstructive  sleep apnea with recent intolerance to home CPAP.   PAST MEDICAL HISTORY:  1.  Coronary artery disease, interventions as stated above.  2.  Obstructive sleep apnea with home CPAP, followed by [name] [name].  3.  Nonischemic cardiomyopathy with an EF of 35%.  4.  Hypertension.  5.  Scoliosis.  6.  Class II heart failure.   MEDICATIONS AT THIS TIME:  1.  Aspirin 325 mg daily.  2.  Lipitor 80 mg daily.  3.  Toprol-XL 200 mg daily.  4.  Enalapril 20 mg p.o. b.i.d.  5.  Lasix 20 mg daily.  6.  Prevacid 30 mg daily.  7.  Aspirin 325 mg daily.  8.  Potassium 20 mEq p.o.  b.i.d.   ALLERGIES:  No known drug allergies.   SOCIAL HISTORY:  The patient lives in [place] with his wife. He has two  sons. He denies any current EtOH, drug, or herbal medication use. Denies any  tobacco use, he has never smoked. He quit using alcohol approximately 1 year  ago.   FAMILY HISTORY:  Father had a history of obstructive sleep apnea, died of  heart disease status post ventricular fibrillation arrest with a history of  alcoholism. Mother with no known CAD.   REVIEW OF SYSTEMS:  Positive for chest pain. Denies any orthopnea, PND, or  any symptoms of congestive heart failure. Negative for any hematuria,  melena. Positive for insomnia and intolerance to CPAP over the last few  nights. Denies any palpitations or syncopal episodes.   PHYSICAL EXAMINATION:  VITAL SIGNS:  Current weight 159, blood pressure  121/79, heart rate 76 and regular.  HEENT:  Pupils equal, round, and reactive to light. Sclerae  are clear.  NECK:  Supple without lymphadenopathy. Negative bruit or JVD.  CARDIOVASCULAR:  Heart rate regular rhythm, S1, S2, without murmurs, rubs,  or gallops.  LUNGS:  Clear to auscultation bilaterally.  ABDOMEN:  Soft and nontender. Positive bowel sounds.  LOWER EXTREMITIES:  Show 2+ DPs, negative for clubbing, cyanosis, or edema.  NEUROLOGIC:  The patient is alert and oriented x3. Cranial nerves II-XII  grossly intact.   Twelve-lead EKG showing normal sinus rhythm at a rate of 70 without any  acute ST or T wave changes.   [name] [name] in to examine and assess the patient. Known history of  coronary artery disease x1 week of exertional angina relieved with rest  without use of nitroglycerin, last episode today. Will plan to admit the  patient to the hospital to the telemetry unit. Continue home medications.  Will anticoagulate with heparin. Nitroglycerin p.r.n. for recurrent chest  discomfort as the patient has severe headaches to nitroglycerin. Will  continue his  home medications including his beta blocker, statin, aspirin,  ACE inhibitor, and diuretic. CPAP per respiratory.      Dorian Pod, NP    ______________________________  [name] [name], M.D.    MB/MEDQ  D:  04/22/2005  T:  04/23/2005  Job:  [id number]

## 2010-07-02 NOTE — Cardiovascular Report (Signed)
NAMEZHAMIR, PIRRO NO.:  [id number]   MEDICAL RECORD NO.:  [id number]          PATIENT TYPE:  INP   LOCATION:  2807                         FACILITY:  [facility]   PHYSICIAN:  Charlies Constable, M.D. [facility] DATE OF BIRTH:  Nov 13, 1960   DATE OF PROCEDURE:  04/25/2005  DATE OF DISCHARGE:                              CARDIAC CATHETERIZATION   CLINICAL HISTORY:  Mr. Standage is 50 years old and has had remote stenting of  the circumflex artery and in 2000 had a bare metal stent placed in the  proximal right coronary artery.  One week ago, he developed exertional  burning similar to the symptoms he had prior to his last intervention.  He  saw Dr. Antoine Poche in the office who felt he had crescendo angina and arranged  for him to come to the hospital for evaluation.   PROCEDURE:  The procedure was performed via the left femoral artery using  arterial sheath and 6 French preformed coronary catheters.  A front wall  arterial puncture was performed and Omnipaque contrast was used.  After  completion of the diagnostic study, we made the decision to proceed with  intervention on the in-stent restenosis in the proximal right coronary  artery.  The patient was given Angiomax bolus and infusion.  He was enrolled  in the [place] trial and was randomized to the [place] study drug versus  Plavix.  We used a JR4-6 guiding catheter with side holes.  We crossed the  lesion in the proximal right coronary artery within the stent with a  Prowater wire without difficulty.  We pre-dilated with a 2.5 by 20 mm  Maverick performing one inflation up to 10 atmospheres for 30 seconds.  We  then deployed a 3 by 23 mm Cypher stent deploying this with one inflation of  16 atmospheres for 20 seconds.  We then post dilated with a 3.5 by 20 mm  Quantum Maverick performing two inflations up to 14 atmospheres for 30  seconds.  Final diagnostic study was then performed through the guiding  catheter.  The patient  tolerated the procedure well and left the laboratory  in satisfactory condition.   RESULTS:  Left main coronary artery was free of significant disease.   Left anterior descending artery gave rise to a diagonal branch and three  septal perforators.  There was 50-70% ostial stenosis in the diagonal  branch.   Circumflex artery gave rise to a ramus branch, atrial branch, and a fairly  large marginal branch.  The stent in the mid portion of the marginal branch  had 0% stenosis within the stent but there was 40% narrowing at the proximal  edge.   The right coronary artery was a moderately large vessel that gave rise to a  right ventricular branch, a posterior descending branch, and three  posterolateral branches.  There was 95% stenosis within the mid portion of  the stent in the proximal right coronary artery and there was TIMI II flow  distally.  The distal right coronary artery also filled via collaterals from  the left coronary artery.   The  left ventriculogram performed in the RAO projection showed slight  hypokinesis of the mid inferior wall.  The overall wall motion was good with  an estimated ejection fraction was 55%.   Following PTCA and stenting of the lesion within the stent, the proximal  right coronary artery stenosis improved from 95% to 0% and the flow improved  from TIMI II to TIMI III flow.   HEMODYNAMIC DATA:  The left ventricular pressure was 104/6 and the aortic  pressure was 104/60 with a mean of 81.   CONCLUSION:  1.  Coronary artery disease status post prior coronary interventions as      described above with 50-70% narrowing in the ostium of the diagonal      branch of the LAD, 40% narrowing in the circumflex marginal vessel at      the proximal edge of the previously placed stent with 0% at the stent      site, 95% stenosis within the stent in the proximal right coronary      artery, and slight inferior wall hypokinesis with an estimated ejection       fraction of 55%.  2.  Successful PCI of the in-stent restenotic lesion in the proximal right      coronary artery using a Cypher drug eluting stent with improvement in      narrowing from 95% to 0% and improvement of the flow from TIMI II to      TIMI III flow.   DISPOSITION:  The patient was returned to his room for further observation.  We will plan discharge tomorrow.  His LV function, which was previously  depressed somewhat, was felt to be a viral cardiomyopathy has recovered to  normal.  Will recommend long term Plavix. The right femoral artery was  closed with Angioseal at the end of the procedure.           ______________________________  Charlies Constable, M.D. [facility]     BB/MEDQ  D:  04/25/2005  T:  04/26/2005  Job:  [id number]   cc:   Durenda Hurt, M.D.  Fax: [phone]   Rollene Rotunda, M.D.  [address]  [address]  [place]  [place] [address]   Rennis Chris. Maple Hudson, M.D.  [facility]  [address]  [place]  [place] [address]

## 2010-07-02 NOTE — Cardiovascular Report (Signed)
NAMESAGAN, MASELLI NO.:  [id number]   MEDICAL RECORD NO.:  [id number]                   PATIENT TYPE:  INP   LOCATION:  2859                                 FACILITY:  [facility]   PHYSICIAN:  Charlies Constable, M.D.                  DATE OF BIRTH:  1960-06-21   DATE OF PROCEDURE:  02/21/2003  DATE OF DISCHARGE:                              CARDIAC CATHETERIZATION   CLINICAL HISTORY:  [name] [name] is a 50 years old and has known coronary artery  disease and has had previous stenting of his right coronary artery and his  circumflex marginal vessel in 2000 and 2001 by Dr. Chales Abrahams.  He has been well  since that time, but over the last two to three weeks he has had symptoms of  upper respiratory congestion and some shortness of breath and some chest  tightness.  His symptoms became worse last night and his wife brought him to  the emergency room at [facility].  His EKG did not show any major  changes, but his blood pressure was 80 and it was felt that he may be having  an acute coronary syndrome.  He was treated with heparin, integrilin and  transferred to [place] for urgent evaluation with angiography.  Dr. Myrtis Ser took the  call.   PROCEDURE:  The procedure was attempted via the right femoral artery, but we  were unable to obtain access and consequently procedure was performed by the  left femoral artery.  We used 6 [place] preformed diagnostic catheters.  After we recognized that the arterial pressure was quite low we placed a  Swan-Ganz catheter via the right femoral vein using a sheath and Swan-Ganz  thermodilution catheter.  The patient tolerated the procedure well and left  the laboratory in satisfactory condition.   RESULTS:  Left main coronary artery:  The left main coronary was free of  significant disease.   Left anterior descending artery:  Left anterior descending artery gave rise  to a large diagonal branch and three septal perforators and smaller  diagonal  branch.  There was 70% ostial stenosis in the first large diagonal branch.   Circumflex artery:  The circumflex artery gave rise to an intermediate  branch, marginal branch and a small AV branch.  There was 0% stenosis at the  stent site in the marginal branch.   Right coronary artery:  The right coronary artery was a dominant vessel.  It  gave rise to a right ventricular branch, posterior descending branch and  three posterior lateral branches.  There was less than 20% narrowing at the  stent in the proximal right coronary artery.  There was 40% narrowing in the  mid and 40% narrowing in the distal right coronary artery.   LEFT VENTRICULOGRAM:  The left ventriculogram performed in the RAO  projection showed marked global hypokinesis with an estimated ejection  fraction of 10%.  There was 2-3+ mitral regurgitation.   HEMODYNAMIC DATA:  The right atrial pressure was 23 mean.  The pulmonary  artery pressure was 56/37 with a mean of 47.  The pulmonary wedge pressure  was 31.  Left ventricular pressure was 82/38 and aortic pressure was 82/72  with a mean of 77.  Cardiac output/cardiac index by Fick was 2.1/1.2  liters/minute/sq m.   CONCLUSIONS:  1. Severe acute nonischemic cardiomyopathy with an ejection fraction of     approximately 10% and a pulmonary wedge pressure of 31.  2. Nonobstructive coronary artery disease with 70% narrowing in the first     diagonal branch of the left anterior descending artery, 0% stenosis at     the stent site in the circumflex marginal vessel, less than 20% narrowing     at the stent in the proximal right coronary artery with 40% narrowing in     the mid and 40% narrowing in the distal right coronary artery.   RECOMMENDATIONS:  The patient has severe acute heart failure with severely  depressed LV function due to a nonischemic cardiomyopathy.  The etiology is  not clear.  This could be a post viral cardiomyopathy.  Will plan admission  to the  intensive care unit, initial treatment with dobutamine and Lasix.  He  is decompensated and his pressure is too low to tolerate beta blockers and  ACE inhibitors currently.  I have asked Dr. Antoine Poche to see him in  consultation.                                               Charlies Constable, M.D.    BB/MEDQ  D:  02/21/2003  T:  02/21/2003  Job:  [id number]   cc:   Durenda Hurt, M.D.  [address]  [place], [place] [address]  Fax: [phone]   Aundra Dubin. Revankar, M.D.  [address]  [place]  [place] [address]  Fax: [phone]   Veneda Melter, M.D.

## 2010-07-02 NOTE — Procedures (Signed)
NAMECREEDON, DANIELSKI NO.:  [id number]   MEDICAL RECORD NO.:  [id number]          PATIENT TYPE:  OUT   LOCATION:  SLEEP CENTER                 FACILITY:  [facility]   PHYSICIAN:  Marcelyn Bruins, M.D. [facility] DATE OF BIRTH:  [birth year]   DATE OF STUDY:  01/01/2004                              NOCTURNAL POLYSOMNOGRAM   REFERRING PHYSICIAN:  Rollene Rotunda, M.D.   INDICATION FOR PROCEDURE:  Hypersomnia with sleep apnea.   EPWORTH SLEEPINESS SCORE:  Epworth score is 3.   SLEEP ARCHITECTURE:  The patient had a total sleep time of only 223 minutes  with a sleep efficiency of only 58%.  There was very decreased REM and the patient never achieved slow wave sleep.  Sleep onset latency was normal and REM onset was slightly prolonged.   IMPRESSION:  1.  Small numbers of obstructive events which do not meet the RDI criteria      of obstructive sleep apnea syndrome.  The patient did have a calculated      RDI of 4 events per hour with transient O2 desaturation as low as 82%.      Events did not appear to be positional. There was moderate snoring and      very large numbers of nonspecific arousals which could possibly be      secondary to the upper airway resistant syndrome.  Clinical correlations      is suggested.  The patient did have very little slow wave sleep or REM      as well as a decreased total sleep time, therefore his degree of sleep      apnea may be underestimated.   1.  No clinically significant cardiac arrhythmias.      KC/MEDQ  D:  01/13/2004 17:01:21  T:  01/14/2004 09:51:25  Job:  [id number]

## 2010-07-02 NOTE — Discharge Summary (Signed)
NAMEJEMERY, [name] [name] NO.:  [id number]   MEDICAL RECORD NO.:  [id number]          PATIENT TYPE:  INP   LOCATION:  6527                         FACILITY:  [facility]   PHYSICIAN:  Charlies Constable, M.D. [facility] DATE OF BIRTH:  [birth year]   DATE OF ADMISSION:  04/22/2005  DATE OF DISCHARGE:  04/26/2005                                 DISCHARGE SUMMARY   PRINCIPAL DIAGNOSIS:  Unstable angina/coronary artery disease.   OTHER DIAGNOSES:  1.  Nonischemic cardiomyopathy with an ejection fraction of 35%.  2.  Hypertension.  3.  Hyperlipidemia.  4.  Obstructive sleep apnea on home C-Pap followed by Dr. Fannie Knee.  5.  History of scoliosis.  6.  Chronic compensated Class II heart failure.   ALLERGIES:  No known drug allergies.   PROCEDURE:  Left heart cardiac catheterization with percutaneous coronary  intervention and stenting of the proximal right coronary artery with a 3.0 x  23 mm Cipher drug-eluding stent.   HISTORY OF PRESENT ILLNESS:  This 50 year old white male with prior history  of CAD as well as nonischemic cardiomyopathy presumed secondary to a viral  infection with an EF of 35% who presented to the congestive heart failure  clinic at [facility] on March 9 complaining of increasing episodes  of exertional angina relieved with rest over the prior week. As a result he  was admitted to [facility] for further evaluation.   HOSPITAL COURSE:  Mr. [name] [name] was initiated on nitrate and heparin  anticoagulation therapy. Cardiac markers were negative x3. Because there was  similarity between his current symptoms and previous symptoms, he was set up  for left heart cardiac catheterization which took place on April 25, 2005  revealing a patent stent in the left circumflex and a 95% stenosis within a  previously placed stent in the proximal right coronary artery. His EF was  55% with inferobasilar hypokinesis. He then underwent successful PCI and  stenting of the proximal  right coronary artery with a 3.0 Xylocaine 23 mm  Cipher drug-eluding stent placed. He tolerated this procedure well and post  procedure his cardiac markers have been negative x3. He has been ambulating  in the hallway this morning without any recurrent symptoms and is being  discharged home today in satisfactory condition.   DISCHARGE LABS:  Hemoglobin 14.4, hematocrit 40.7, WBC 8.5, platelets 268,  MCV 85.8, sodium 137, potassium 4.3, chloride 102, CO2 29, BUN 9, creatinine  1.1, glucose 128, total bilirubin 0.7, alkaline phosphatase 63, AST 31, ALT  21, albumin 3.9, CK 62, MB 1.1, troponin I 0.02, total cholesterol 137,  triglycerides 155, HDL 28, LDL 78, calcium 9.4, hemoglobin A1C 5.9.   DISPOSITION:  The patient is being discharged home today in good condition.   FOLLOW-UP PLANS AND APPOINTMENTS:  He is asked to follow-up with his primary  care physician, Dr. Jeanie Sewer, in approximately 3 to 4 weeks. He has a follow-  up appointment at [facility] with Dr. Jenene Slicker PA on March 26 at  2:30 p.m.   DISCHARGE MEDICATIONS:  1.  Aspirin 325 mg q  d.  2.  Plavix 75 mg q d.  3.  Enalapril 20 mg b.i.d.  4.  Lasix 20 mg q d.  5.  Prevacid 30 mg q d.  6.  Potassium chloride 20 milliequivalents b.i.d.  7.  Advicor 500/20 mg q.h.s. (this is new in place of Lipitor secondary to      low HDL).  8.  Nitroglycerin 0.4 mg sublingual p.r.n. chest pain.   OUTSTANDING LAB STUDIES:  None.   DURATION DISCHARGE ENCOUNTER:  45 minutes including physician time.      Ok Anis, NP    ______________________________  Charlies Constable, M.D. [facility]    CRB/MEDQ  D:  04/26/2005  T:  04/27/2005  Job:  [id number]

## 2010-07-02 NOTE — Assessment & Plan Note (Signed)
[facility]                            CARDIOLOGY OFFICE NOTE   NAME:[name], [name]                          MRN:          [id number]  DATE:01/26/2006                            DOB:          08/05/1960    PRIMARY:  Dr. Gwendlyn Deutscher.   REASON FOR PRESENTATION:  Evaluate the patient with coronary disease and  recent stenting.   HISTORY OF PRESENT ILLNESS:  The patient returns for follow up.  He had  a stent earlier this year with anatomy described below.  He is now 50  years old.  He says he has done well since that time.  He has had none  of the chest discomfort that prompted that evaluation.  He has had no  neck or arm discomfort.  He has had no palpitations, presyncope or  syncope.  He denies any PND or orthopnea.  Of note, I did review his  blood work and increased his fenofibrate since the last visit.  He did  not, however, come back for his fasting lipids and liver as instructed.   PAST MEDICAL HISTORY:  1. Coronary artery disease (previous severe cardiomyopathy with a      remote myocardial infarction and bare-metal stenting of the right      coronary artery in 2000).  Repeat catheterization in 2007      demonstrated 50-70% narrowing in the ostium of the diagonal of the      LAD, 40% narrowing in the circumflex marginal at the proximal area      of the previously placed stent with 0% stenosis in the stent, 95%      stenosis in the right coronary artery.  His EF was 55%.  He      underwent Cypher stenting to the stenosed area.  2. Hypertension.  3. Hyperlipidemia.  4. Sleep apnea.  5. Scoliosis.   ALLERGIES:  None.   MEDICATIONS:  1. Toprol 200 mg daily.  2. Enalapril 20 mg b.i.d.  3. Lasix 20 mg daily.  4. Prevacid 30 mg daily.  5. Plavix 75 mg daily.  6. Fenofibrate 160 mg daily.  7. Lipitor 40 mg q.h.s.  8. Aspirin 162 mg daily.   REVIEW OF SYSTEMS:  As stated in the HPI and otherwise negative for  other systems.   PHYSICAL  EXAMINATION:  The patient is well-appearing and in no distress.  Blood pressure 104/78, heart rate 87 and regular.  HEENT:  Eyes unremarkable.  Pupils are equal, round, and reactive to  light.  Fundi not visualized.  Oral mucosa unremarkable.  NECK:  No jugular venous distention to 45 degrees.  Carotid upstroke  brisk and symmetric.  No bruits, no thyromegaly.  LYMPHATICS:  No cervical, axillary or inguinal adenopathy.  LUNGS:  Clear to auscultation bilaterally.  BACK:  No costovertebral angle tenderness.  CHEST:  Unremarkable.  HEART:  PMI not displaced or sustained.  S1-S2 within normal limits.  No  S3, no S4, no rubs, no clicks, no murmurs.  ABDOMEN:  Obese.  Positive bowel sounds.  Normal in frequency, pitch.  No bruits,  rebound, guarding and no midline pulsatile masses.  No  hepatomegaly or splenomegaly.  SKIN:  No rashes.  No nodules.  EXTREMITIES:  Pulses 2+ throughout.  No clubbing, no cyanosis, no edema.  NEUROLOGICAL:  Oriented to person, place and time.  Cranial nerves II  through XII grossly intact.  Motor grossly intact throughout.   EKG sinus rhythm, leftward axis, intervals within normal limits.  No  acute ST-T wave changes.  Diffuse anterior T-wave flattening.   ASSESSMENT/PLAN:  1. Coronary disease:  The patient is having no new symptoms.  He is      going to continue with aggressive secondary risk reduction.      Unfortunately, he is not dieting and exercising as I have suggested      and want to continue to encourage that he does this.  2. Obesity:  This should be treated with diet and exercise as above.      We have discussed this at length.  3. Dyslipidemia:  He is going to get a lipid profile and liver enzymes      when he comes back fasting and we will make adjustments.  His goal      is to have an LDL less than 100 and HDL in the 40s.  4. Follow-up:  I will see him back again in about six months.  He is      to remain on his Plavix until that time and we will  discuss it      after that.     Rollene Rotunda, MD, [facility]  Electronically Signed    JH/MedQ  DD: 01/26/2006  DT: 01/27/2006  Job #: [id number]   cc:   Durenda Hurt, M.D.

## 2010-07-02 NOTE — H&P (Signed)
NAMEALANTE, TOLAN NO.:  [id number]   MEDICAL RECORD NO.:  [id number]                   PATIENT TYPE:  INP   LOCATION:  2922                                 FACILITY:  [facility]   PHYSICIAN:  Rollene Rotunda, M.D.                DATE OF BIRTH:  02/25/1960   DATE OF ADMISSION:  02/21/2003  DATE OF DISCHARGE:                                HISTORY & PHYSICAL   PRIMARY CARE PHYSICIAN:  Dr. Gwendlyn Deutscher.   REASON FOR PRESENTATION/EVALUATION:  Chest pain.   HISTORY OF PRESENT ILLNESS:  The patient is a pleasant 50 year old gentleman  with a prior history of coronary artery disease, status post percutaneous  revascularization x2.  He reports that he has been fatigued for the last 1-2  months.  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  He does have occasional dyspnea with  exertion.  He has good days and bad days.  He is not describing any  orthopnea.  He wakes up sometimes in the late morning with increased  shortness of breath but is not describing classic PND.  He has had no lower  extremity swelling.  He has had weight loss and denies weight gain.  He had  not been having any chest discomfort, neck discomfort, or arm discomfort;  however, early this morning, he had some chest discomfort.  He thought it  was indigestion.  He got up and went to the bathroom and felt presyncopal.  He did not actually lose consciousness but says that he came close.  He did  call the EMS and was taken to [facility].  He had no acute changes on  his EKG.  Labs initially did not demonstrate any enzyme elevation.  He was  brought emergently to the catheterization lab where Dr. Juanda Chance did a  catheterization and found global hypokinesis with an EF of 10%.  He had a  previous stent to the right coronary artery, which was patent, and had a mid  40% right and distal 40% right stenosis.  The circumflex had a mid  stent  which was patent.  There was a diagonal, which had a nonobstructive ostial  70% lesion.  He had 2-3+ mitral regurgitation.   The patient currently is pain free and able to lay flat.   PAST MEDICAL HISTORY:  1. Coronary artery disease, status post percutaneous revascularization with     stenting of the right coronary artery in 2000 and a non-Q-wave myocardial     infarction with percutaneous stenting in April, 2001.  2. Dyslipidemia.  3. Previously well-preserved ejection fraction.  4. Hypertension.  5. Scoliosis.   ALLERGIES:  None.   MEDICATIONS:  1. Aspirin.  2. Norvasc 5 mg q.d.  3. Altace 2.5 mg q.d.  4. Lipitor 40 mg q.h.s.  5. Toprol XL  25 mg q.d.   SOCIAL HISTORY:  He is married.  He does drink alcohol and says it may be 2-  3 beers per night, although he denies anymore than this, and his wife  corroborates this.  There are weeks when he goes without drinking at all.  He drinks only beer.  He does not smoke cigarettes.  He has two sons.   FAMILY HISTORY:  Noncontributory for any obvious history of cardiomyopathy  or heart failure.  His dad, who was an alcoholic, had a V fib arrest at age  52.  He was resuscitated but had anoxic brain injury, surviving nine more  years.   REVIEW OF SYSTEMS:  As stated in the HPI.  Positive for sinus congestion,  which he has had chronically in the winters.  Otherwise, as stated in the  HPI, negative for all other systems.   PHYSICAL EXAMINATION:  GENERAL:  Patient is laying flat.  He is in no  distress.  VITAL SIGNS:  Blood pressure 80s/70s, heart rate 90s and regular.  HEENT:  Eyes are unremarkable.  Pupils are equal, round and reactive to  light.  Fundi not visualized.  Oral mucosa unremarkable.  NECK:  Patient is laying flat.  Carotid upstrokes are brisk and symmetric.  No bruits.  No thyromegaly.  LYMPHATICS:  No lymphadenopathy.  LUNGS:  Clear to auscultation bilaterally.  BACK:  No costovertebral angle tenderness.   CHEST:  Unremarkable.  HEART:  PMI not displaced or sustained.  S1 and S2 within normal limits.  Positive S3.  No S4.  No murmurs.  ABNORMAL:  Flat.  Positive bowel sounds.  Normal in frequency, pitch.  No  murmurs.  No rebound.  No guarding.  No midline __________.  No  hepatomegaly.  No splenomegaly.  SKIN:  No rash, nodules.  EXTREMITIES:  Pulses 2+.  No edema.  No clubbing or cyanosis.  NEURO:  Oriented to person, place, and time.  Cranial nerves II-XII are  grossly intact.  Motor grossly intact.   EKG:  Sinus rhythm.  Rate 97.  Axis within normal limits.  Intervals within  normal limits.  Diffuse nonspecific T wave flattening.   LABS:  Sodium 139, potassium 3.8, chloride 100, BUN 19, creatinine 1.2, AST  31, ALT 25, alkaline phosphatase 72.  CK 58, MB 0.1.  Hemoglobin 15.1, WBC  9.8, platelets 283.   ASSESSMENT/PLAN:  Cardiomyopathy of unclear etiology:  It is not ischemic.  It could be related to alcohol, although he describes only moderate,  intermittent use.  He should abstain from alcohol completely.  He obviously  could have a viral cardiomyopathy.  There is not a hint of a family history.  At this point, medical options are limited.  He has only class II symptoms,  by his report.  We will try dobutamine, as he certainly has low output by  his Swan readings and a markedly elevated wedge.  He probably is relatively  euvolemic.  He might tolerate some very slow hydration for his blood  pressure, though his EDP puts him at great risk for pulmonary edema.  There  is no role for Natrecor at this point.  His lack of acute volume overload or  organ dysfunction, I do not think, makes him a left  ventricular assist device candidate or a transplant candidate at this point.  Perhaps in a couple of days we will be able to start a low dose ACE  inhibitor and begin slowly med titration.  He  will need education.  I would like to be able to follow him in the heart failure clinic.                                                 Rollene Rotunda, M.D.    JH/MEDQ  D:  02/21/2003  T:  02/21/2003  Job:  [id number]   cc:   Durenda Hurt, M.D.  [address]  [place], [place] [address]  Fax: [phone]

## 2010-08-15 ENCOUNTER — Other Ambulatory Visit: Payer: Self-pay | Admitting: Cardiology

## 2010-09-10 ENCOUNTER — Other Ambulatory Visit: Payer: Self-pay | Admitting: Cardiology

## 2010-09-20 ENCOUNTER — Other Ambulatory Visit: Payer: Self-pay | Admitting: *Deleted

## 2010-09-20 MED ORDER — METOPROLOL SUCCINATE ER 200 MG PO TB24: 200.0000 mg | ORAL_TABLET | Freq: Every day | ORAL | Status: DC

## 2011-01-12 ENCOUNTER — Telehealth: Payer: Self-pay | Admitting: Cardiology

## 2011-01-12 NOTE — Telephone Encounter (Signed)
I cannot find anything in the note about a clearance question.  We will need to call the patient

## 2011-01-12 NOTE — Telephone Encounter (Signed)
New problem:  Status of clearance. Fax note on 11/26.

## 2011-01-13 NOTE — Telephone Encounter (Signed)
Pt last seen 11/2009 will need an appointment for cardiac clearance.

## 2011-01-20 ENCOUNTER — Telehealth: Payer: Self-pay | Admitting: Cardiology

## 2011-01-20 NOTE — Telephone Encounter (Signed)
[name] [name] is aware that pt is scheduled for a cardiac clearance appointment 12/12.

## 2011-01-20 NOTE — Telephone Encounter (Signed)
Checking on status of surgical clearance, for dc plavix, said we are the managing dr for plavix

## 2011-01-26 ENCOUNTER — Encounter: Payer: Self-pay | Admitting: Physician Assistant

## 2011-01-26 ENCOUNTER — Ambulatory Visit (INDEPENDENT_AMBULATORY_CARE_PROVIDER_SITE_OTHER): Payer: BC Managed Care – PPO | Admitting: Physician Assistant

## 2011-01-26 VITALS — BP 124/80 | HR 66 | Resp 18 | Ht 64.0 in | Wt 184.1 lb

## 2011-01-26 DIAGNOSIS — E785 Hyperlipidemia, unspecified: Secondary | ICD-10-CM

## 2011-01-26 DIAGNOSIS — I2510 Atherosclerotic heart disease of native coronary artery without angina pectoris: Secondary | ICD-10-CM

## 2011-01-26 DIAGNOSIS — Z01810 Encounter for preprocedural cardiovascular examination: Secondary | ICD-10-CM

## 2011-01-26 DIAGNOSIS — I10 Essential (primary) hypertension: Secondary | ICD-10-CM

## 2011-01-26 NOTE — Progress Notes (Signed)
[address] [place], [place]  [address] Phone: [phone] Fax:  [phone]  Date:  01/26/2011   Name:  [name] [name]       DOB:  May 10, 1960 MRN:  [id number]  PCP:  Dr. Jeanie Sewer Primary Cardiologist:  Dr. Rollene Rotunda    History of Present Illness: [name] [name] is a 50 y.o. male who presents for surgical clearance.  He has a history of CAD, status post remote MI with bare-metal stent to the RCA and prior stenting to the circumflex in 2000, ischemic cardiomyopathy with a previous EF of 35-40%.  Last LHC 3/07: oDx 50-70%, mOM stent ok with 40% at prox edge, pRCA stent 95% ISR, L-R collats, EF 55%.  He had PCI with Cypher DES to the RCA.  Other hx includes HTN, HLP and OSA.  Last ETT 10/11: Normal.    He needs to have some lower back procedures performed.  The plan is to proceed with a myelogram initially.  This will likely be after the first of the year.  He has a history of scoliosis and significant degenerative disc disease.  His surgeon is Dr. Flo Shanks in [facility]  Apparently, he needs to be off of both Plavix and aspirin.  He denies chest pain or shortness of breath.  He denies orthopnea, PND or edema.  He denies palpitations or syncope.  He is somewhat limited by his back problems.  I am not certain that he can achieve 4 METs.  Past Medical History  Diagnosis Date  . Coronary artery disease     h/o MI; s/p stent to RCA and CFX in 2000;  LHC 3/07: oDx 50-70%, mOM stent ok with 40% at prox edge, pRCA stent 95% ISR, L-R collats, EF 55%.  He had PCI with Cypher DES to the RCA  . Sleep apnea   . Hyperlipidemia   . Hypertension   . Obesity   . Scoliosis   . DDD (degenerative disc disease)     Current Outpatient Prescriptions  Medication Sig Dispense Refill  . citalopram (CELEXA) 40 MG tablet Take 40 mg by mouth daily.        . clopidogrel (PLAVIX) 75 MG tablet Take 75 mg by mouth daily.        . enalapril (VASOTEC) 20 MG tablet TAKE 1 TABLET TWICE DAILY  *CALL OFFICE FOR APPOINTMENT  60 tablet  5  . fenofibrate micronized (LOFIBRA) 200 MG capsule Take 200 mg by mouth daily.       . lansoprazole (PREVACID) 30 MG capsule Take 30 mg by mouth daily.        . metoprolol (TOPROL-XL) 200 MG 24 hr tablet Take 1 tablet (200 mg total) by mouth daily.  30 tablet  6  . nitroGLYCERIN (NITROSTAT) 0.4 MG SL tablet Place 0.4 mg under the tongue every 5 (five) minutes as needed.        Marland Kitchen oxycodone-acetaminophen (PERCOCET) 2.5-325 MG per tablet Take 1 tablet by mouth as needed.        . rosuvastatin (CRESTOR) 10 MG tablet Take 10 mg by mouth daily.          Allergies: No Known Allergies  History  Substance Use Topics  . Smoking status: Never Smoker   . Smokeless tobacco: Not on file  . Alcohol Use: No     ROS:  Please see the history of present illness.    All other systems reviewed and negative.   PHYSICAL EXAM: VS:  BP 124/80  Pulse 66  Resp 18  Ht 5\' 4"  (1.626 m)  Wt 184 lb 1.9 oz (83.516 kg)  BMI 31.60 kg/m2 Well nourished, well developed, in no acute distress HEENT: normal Neck: no JVD Vascular: No carotid bruits Cardiac:  normal S1, S2; RRR; no murmur Lungs:  clear to auscultation bilaterally, no wheezing, rhonchi or rales Abd: soft, nontender, no hepatomegaly Ext: no edema Skin: warm and dry Neuro:  CNs 2-12 intact, no focal abnormalities noted Psych: Normal affect  EKG:   Sinus rhythm, heart rate 66, normal axis, nonspecific ST-T wave changes  ASSESSMENT AND PLAN:

## 2011-01-26 NOTE — Assessment & Plan Note (Signed)
Managed by primary care.  Goal LDL less than 70. 

## 2011-01-26 NOTE — Patient Instructions (Addendum)
Your physician wants you to follow-up in: 6 MONTHS WITH DR. HOCHREIN. You will receive a reminder letter in the mail two months in advance. If you don't receive a letter, please call our office to schedule the follow-up appointment.  Your physician has requested that you have a lexiscan myoview DX V72.81 PRE SURG CLEARANCE AND 414.01 CAD. For further information please visit [email or web address] Please follow instruction sheet, as given.

## 2011-01-26 NOTE — Assessment & Plan Note (Signed)
Controlled.  

## 2011-01-26 NOTE — Assessment & Plan Note (Addendum)
Overall stable without symptoms of angina.  He will have a myelogram in the next month.  He may need extensive back surgery.  The plan will depend upon the results of his myelogram.  Ideally, it would be best to keep him on at least aspirin throughout his procedure to prevent cardiovascular complications.  Apparently he needs to have Plavix and aspirin held.  I am not certain that he can achieve 4 METs.  It has been 5 years since his PCI.  I will have him undergo a Lexiscan Myoview for risk stratification.  If this is low risk, he can proceed with this procedure at acceptible cardiovascular risk.  In the event he is cleared for his procedure, if the risk of bleeding is too great with both aspirin and Plavix, both of these agents should be restarted as soon as possible after his procedure.  I will have him followup with Dr. Rollene Rotunda in 6 months.

## 2011-01-31 ENCOUNTER — Ambulatory Visit (HOSPITAL_COMMUNITY): Payer: BC Managed Care – PPO | Attending: Cardiology | Admitting: Radiology

## 2011-01-31 DIAGNOSIS — I252 Old myocardial infarction: Secondary | ICD-10-CM | POA: Insufficient documentation

## 2011-01-31 DIAGNOSIS — Z9861 Coronary angioplasty status: Secondary | ICD-10-CM | POA: Insufficient documentation

## 2011-01-31 DIAGNOSIS — I2510 Atherosclerotic heart disease of native coronary artery without angina pectoris: Secondary | ICD-10-CM | POA: Insufficient documentation

## 2011-01-31 DIAGNOSIS — Z01810 Encounter for preprocedural cardiovascular examination: Secondary | ICD-10-CM | POA: Insufficient documentation

## 2011-01-31 DIAGNOSIS — I10 Essential (primary) hypertension: Secondary | ICD-10-CM | POA: Insufficient documentation

## 2011-01-31 DIAGNOSIS — E785 Hyperlipidemia, unspecified: Secondary | ICD-10-CM | POA: Insufficient documentation

## 2011-01-31 MED ORDER — TECHNETIUM TC 99M TETROFOSMIN IV KIT
30.0000 | PACK | Freq: Once | INTRAVENOUS | Status: AC | PRN
  Administered 2011-01-31: 30 via INTRAVENOUS

## 2011-01-31 MED ORDER — REGADENOSON 0.4 MG/5ML IV SOLN
0.4000 mg | Freq: Once | INTRAVENOUS | Status: AC
  Administered 2011-01-31: 0.4 mg via INTRAVENOUS

## 2011-01-31 MED ORDER — TECHNETIUM TC 99M TETROFOSMIN IV KIT
10.0000 | PACK | Freq: Once | INTRAVENOUS | Status: AC | PRN
  Administered 2011-01-31: 10 via INTRAVENOUS

## 2011-01-31 NOTE — Progress Notes (Signed)
[facility] SITE 3 NUCLEAR MED [address] [place] [address] [phone]  Cardiology Nuclear Med Study  [name] [name] is a 50 y.o. male [id number] Jun 26, 1960   Nuclear Med Background Indication for Stress Test:  Evaluation for Ischemia, Surgical Clearance for pending Back surgery with Dr. Flo Shanks, Stent Patency and PTCA Patency History: 03/07 Angioplasty, 02/06 Echo, 10/11 GXT, 03/07 Heart Catheterization, 04/01 Myocardial Infarction, 07/03 Myocardial Perfusion Study, '01 Stents and Cardiomyopathy Cardiac Risk Factors: Hypertension and Lipids  Symptoms:  none   Nuclear Pre-Procedure Caffeine/Decaff Intake:  None NPO After: 7:00pm   Lungs:  clear IV 0.9% NS with Angio Cath:  22g  IV Site: L Hand  IV Started by:  Cathlyn Parsons, RN  Chest Size (in):  40 Cup Size: n/a  Height: 5\' 4"  (1.626 m)  Weight:  175 lb (79.379 kg)  BMI:  Body mass index is 30.04 kg/(m^2). Tech Comments:  Toprol held x 14 hrs    Nuclear Med Study 1 or 2 day study: 1 day  Stress Test Type:  Treadmill/Lexiscan  Reading MD: Marca Ancona, MD  Order Authorizing Provider:  Melany Guernsey  Resting Radionuclide: Technetium 23m Tetrofosmin  Resting Radionuclide Dose: 11.0 mCi   Stress Radionuclide:  Technetium 44m Tetrofosmin  Stress Radionuclide Dose: 33.0 mCi           Stress Protocol Rest HR: 76 Stress HR: 110  Rest BP: 117/74 Stress BP: 143/78  Exercise Time (min): n/a METS: n/a   Predicted Max HR: 169 bpm % Max HR: 65.09 bpm Rate Pressure Product: 21308   Dose of Adenosine (mg):  n/a Dose of Lexiscan: 0.4 mg  Dose of Atropine (mg): n/a Dose of Dobutamine: n/a mcg/kg/min (at max HR)  Stress Test Technologist: Milana Na, EMT-P  Nuclear Technologist:  Domenic Polite, CNMT     Rest Procedure:  Myocardial perfusion imaging was performed at rest 45 minutes following the intravenous administration of Technetium 59m Tetrofosmin. Rest ECG: NSR  Stress Procedure:  The  patient received IV Lexiscan 0.4 mg over 15-seconds with concurrent low level exercise and then Technetium 53m Tetrofosmin was injected at 30-seconds while the patient continued walking one more minute.  There were no significant changes, + sob, and abdominal cramps with Lexiscan.  Quantitative spect images were obtained after a 45-minute delay. Stress ECG: No significant change from baseline ECG  QPS Raw Data Images:  Normal; no motion artifact; normal heart/lung ratio. Stress Images:  Small, mild mid anterior perfusion defect.  Rest Images:  Normal homogeneous perfusion.  Subtraction (SDS):  Small, mild reversible mid anterior perfusion defect.  Transient Ischemic Dilatation (Normal <1.22):  0.98 Lung/Heart Ratio (Normal <0.45):  0.30  Quantitative Gated Spect Images QGS EDV:  84 ml QGS ESV:  33 ml QGS cine images:  NL LV Function; NL Wall Motion QGS EF: 61%  Impression Exercise Capacity:  Lexiscan with no exercise. BP Response:  Normal blood pressure response. Clinical Symptoms:  Warm, short of breath.  ECG Impression:  No significant ST segment change suggestive of ischemia. Comparison with Prior Nuclear Study: Small mid anterior defect was not seen prior.   Overall Impression:  Low risk stress nuclear study. There is a small, reversible, mild mid anterior perfusion defect.  Cannot rule out ischemia but may be attenuation.  Normal wall motion and EF.   [name] [employer]

## 2011-03-25 ENCOUNTER — Other Ambulatory Visit: Payer: Self-pay | Admitting: Cardiology

## 2011-04-25 ENCOUNTER — Other Ambulatory Visit: Payer: Self-pay | Admitting: *Deleted

## 2011-04-25 MED ORDER — CLOPIDOGREL BISULFATE 75 MG PO TABS: 75.0000 mg | ORAL_TABLET | Freq: Every day | ORAL | Status: DC

## 2011-04-25 NOTE — Telephone Encounter (Signed)
Refilled clopidogrel.

## 2011-05-22 ENCOUNTER — Other Ambulatory Visit: Payer: Self-pay | Admitting: Cardiology

## 2011-10-24 ENCOUNTER — Other Ambulatory Visit: Payer: Self-pay | Admitting: Cardiology

## 2011-10-26 ENCOUNTER — Other Ambulatory Visit: Payer: Self-pay | Admitting: Cardiology

## 2011-10-26 NOTE — Telephone Encounter (Signed)
.   Requested Prescriptions   Pending Prescriptions Disp Refills  . metoprolol (TOPROL-XL) 200 MG 24 hr tablet [Pharmacy Med Name: METOPROLOL SUCC ER 200 MG TAB] 30 tablet 6    Sig: TAKE 1 TABLET BY MOUTH DAILY.

## 2012-01-27 ENCOUNTER — Other Ambulatory Visit: Payer: Self-pay | Admitting: Cardiology

## 2012-05-03 ENCOUNTER — Telehealth: Payer: Self-pay | Admitting: Cardiology

## 2012-05-03 NOTE — Telephone Encounter (Signed)
New Problem:    Patient called in because he had an angioplasty and a stent placed while he was in [place] and wanted to know how to proceed with his care and follow-up.  Please call back.

## 2012-05-03 NOTE — Telephone Encounter (Signed)
Per pt - had MI in [place] this weekend and needs to schedule follow up.  Pt given an appointment for 3/27 with Tereso Newcomer, PA   He will make sure to obtain records prior to this appointment.

## 2012-05-10 ENCOUNTER — Encounter: Payer: Self-pay | Admitting: Physician Assistant

## 2012-05-10 ENCOUNTER — Ambulatory Visit: Payer: BC Managed Care – PPO | Admitting: Physician Assistant

## 2012-05-10 ENCOUNTER — Ambulatory Visit (INDEPENDENT_AMBULATORY_CARE_PROVIDER_SITE_OTHER): Payer: BC Managed Care – PPO | Admitting: Physician Assistant

## 2012-05-10 VITALS — BP 122/82 | HR 66 | Ht 64.0 in | Wt 177.0 lb

## 2012-05-10 DIAGNOSIS — I10 Essential (primary) hypertension: Secondary | ICD-10-CM

## 2012-05-10 DIAGNOSIS — I2510 Atherosclerotic heart disease of native coronary artery without angina pectoris: Secondary | ICD-10-CM

## 2012-05-10 DIAGNOSIS — I214 Non-ST elevation (NSTEMI) myocardial infarction: Secondary | ICD-10-CM

## 2012-05-10 DIAGNOSIS — E785 Hyperlipidemia, unspecified: Secondary | ICD-10-CM

## 2012-05-10 MED ORDER — ROSUVASTATIN CALCIUM 20 MG PO TABS: 20.0000 mg | ORAL_TABLET | Freq: Every day | ORAL | Status: DC

## 2012-05-10 NOTE — Patient Instructions (Addendum)
Your physician has recommended you make the following change in your medication: INCREASE your Crestor to 20 mg daily  Have your primary physician fax a copy of your labs to [facility], PA-C.  Fax#=[phone]  Your physician recommends that you schedule a follow-up appointment in: 4-6 weeks with Dr Antoine Poche

## 2012-05-10 NOTE — Progress Notes (Signed)
[address] [place], [place]  [address] Phone: [phone] Fax:  [phone]  Date:  05/10/2012   ID:  [name] [name], DOB [birth year], MRN [id number]  PCP:  Noni Saupe., MD  Primary Cardiologist:  Dr. Rollene Rotunda     History of Present Illness: [name] [name] is a 52 y.o. male who returns for follow up after a recent MI in [place], [place].  He has a hx of CAD, s/p remote MI with bare-metal stent to the RCA and prior stenting to the CFX in 2000, ischemic cardiomyopathy with a previous EF of 35-40%. Last LHC 3/07: oDx 50-70%, mOM stent ok with 40% at prox edge, pRCA stent 95% ISR, L-R collats, EF 55%. He had PCI with Cypher DES to the RCA. Other hx includes HTN, HL and OSA. Last ETT 10/11: Normal.  Lexiscan Myoview 12/12: low risk, small, reversible, mild mid anterior perfusion defect (cannot r/o ischemia but may be attenuation), EF 61%.  Med Rx continued. Last seen in this office 12/12 for surgical clearance for low back surgery.  Patient was in [place] for a business trip and developed sudden onset of CP.  I have no records.  He was seen at [facility].  By his description, he had a NSTEMI.  He tells me he had PCI with stenting (not sure if DES or BMS) to an unknown vessel.  He had a radial approach.  He was switched from Plavix to Brilinta.  He describes having an echo that demonstrated normal LVF.  His hospital course was uncomplicated.  Since d/c, no chest pain, dyspnea, orthopnea, PND, edema, syncope.  He has noted some epistaxis and some scant BRBPR (on the tissue only).  Otherwise, no frank bleeding.    Wt Readings from Last 3 Encounters:  05/10/12 177 lb (80.287 kg)  01/31/11 175 lb (79.379 kg)  01/26/11 184 lb 1.9 oz (83.516 kg)     Past Medical History  Diagnosis Date  . Coronary artery disease     h/o MI; s/p stent to RCA and CFX in 2000;  LHC 3/07: oDx 50-70%, mOM stent ok with 40% at prox edge, pRCA stent 95% ISR, L-R collats, EF  55%.  He had PCI with Cypher DES to the RCA;  s/p NSTEMI at [facility] in [place] 3/14 => PCI (records pending)  . Sleep apnea   . Hyperlipidemia   . Hypertension   . Obesity   . Scoliosis   . DDD (degenerative disc disease)     Current Outpatient Prescriptions  Medication Sig Dispense Refill  . enalapril (VASOTEC) 20 MG tablet TAKE 1 TABLET TWICE DAILY *CALL OFFICE FOR APPOINTMENT  60 tablet  0  . fenofibrate micronized (LOFIBRA) 200 MG capsule Take 200 mg by mouth daily.       . lansoprazole (PREVACID) 30 MG capsule Take 30 mg by mouth daily.        . methocarbamol (ROBAXIN) 500 MG tablet Take 500 mg by mouth 3 (three) times daily.      . metoprolol (TOPROL-XL) 200 MG 24 hr tablet TAKE 1 TABLET BY MOUTH DAILY.  30 tablet  6  . nitroGLYCERIN (NITROSTAT) 0.3 MG SL tablet Place 0.3 mg under the tongue every 5 (five) minutes as needed for chest pain.      Marland Kitchen oxyCODONE-acetaminophen (PERCOCET) 7.5-325 MG per tablet Take 1 tablet by mouth every 4 (four) hours as needed for pain.      . rosuvastatin (CRESTOR)  10 MG tablet Take 10 mg by mouth daily.        . Ticagrelor (BRILINTA) 90 MG TABS tablet Take 90 mg by mouth 2 (two) times daily.       No current facility-administered medications for this visit.    Allergies:   No Known Allergies  Social History:  The patient  reports that he has never smoked. He does not have any smokeless tobacco history on file. He reports that he does not drink alcohol.   ROS:  Please see the history of present illness.      All other systems reviewed and negative.   PHYSICAL EXAM: VS:  BP 122/82  Pulse 66  Ht 5\' 4"  (1.626 m)  Wt 177 lb (80.287 kg)  BMI 30.37 kg/m2 Well nourished, well developed, in no acute distress HEENT: normal Neck: no JVD Cardiac:  normal S1, S2; RRR; no murmur Lungs:  clear to auscultation bilaterally, no wheezing, rhonchi or rales Abd: soft, nontender, no hepatomegaly Ext: no edema; right wrist without hematoma  or bruit  Skin: warm and dry Neuro:  CNs 2-12 intact, no focal abnormalities noted  EKG:  NSR, HR 66, LAD, NSSTTW changes     ASSESSMENT AND PLAN:  1. CAD:  Doing well since recent NSTEMI tx with PCI in [place].  Continue DAPT.  Will request records.  If he continues to have frequent epistaxis or hemorrhoidal bleeding, consider changing Brilinta to Plavix after 30 days post PCI.  Will need to see records first.  He is not interested in cardiac rehab.  He will increase activity on his own. 2. Hyperlipidemia:  With recent MI, would recommend high dose statin.  Increase Crestor to 20 mg QD.  He typically has labs checked with his PCP and has an appt in 3 mos. 3. Hypertension:  Controlled.  Continue current therapy.  4. Disposition:  F/u with Dr. Rollene Rotunda in 4-6 weeks.  We will request records from [place].    Signed, Tereso Newcomer, PA-C  2:40 PM 05/10/2012

## 2012-05-16 ENCOUNTER — Telehealth: Payer: Self-pay | Admitting: Chiropractic Medicine

## 2012-05-16 NOTE — Telephone Encounter (Signed)
ROI was mailed to [facility] I have called [phone] Several times With no attempt of the Line Working, I have Mailed the ROI to Hospital, the  Original ROI has been placed in my Drawer under ROI 05/16/12/KM

## 2012-05-22 ENCOUNTER — Other Ambulatory Visit: Payer: Self-pay | Admitting: *Deleted

## 2012-05-22 MED ORDER — ROSUVASTATIN CALCIUM 20 MG PO TABS: 20.0000 mg | ORAL_TABLET | Freq: Every day | ORAL | Status: DC

## 2012-05-22 NOTE — Telephone Encounter (Signed)
s/w pt about needing prior auth for crestor. s/w [employer] and crestor approved for 24 months. rx called into [id number] store., pt notified about PA approved for crestor. pt said thank you.

## 2012-05-23 ENCOUNTER — Encounter: Payer: Self-pay | Admitting: Cardiology

## 2012-05-29 ENCOUNTER — Other Ambulatory Visit: Payer: Self-pay | Admitting: Cardiology

## 2012-06-20 ENCOUNTER — Telehealth: Payer: Self-pay | Admitting: Physician Assistant

## 2012-06-20 NOTE — Telephone Encounter (Signed)
Spoke With Someone in Medical Records, she Let Me Know I Needed To fax The ROI  To the Cath Lab, called cath Lab spoke with Reather Littler Faxed the ROI over to [phone] And asked For "CD of Angioplasty" to Be Mailed to Our Facility. She stated Once she received the Release she Will Mail out. ROI faxed to [phone] ( cath lab) 11:22 am   06/20/12/KM

## 2012-06-25 ENCOUNTER — Ambulatory Visit: Payer: BC Managed Care – PPO | Admitting: Cardiology

## 2012-06-27 ENCOUNTER — Telehealth: Payer: Self-pay | Admitting: Physician Assistant

## 2012-06-27 NOTE — Telephone Encounter (Signed)
CD of Angioplasty Rec Via Mail will Hold Onto until Carol/Scott come back  In Office   06/27/12/KM

## 2012-07-02 ENCOUNTER — Encounter: Payer: Self-pay | Admitting: Cardiology

## 2012-07-12 ENCOUNTER — Ambulatory Visit (INDEPENDENT_AMBULATORY_CARE_PROVIDER_SITE_OTHER): Payer: BC Managed Care – PPO | Admitting: Cardiology

## 2012-07-12 ENCOUNTER — Encounter: Payer: Self-pay | Admitting: Cardiology

## 2012-07-12 VITALS — BP 100/70 | HR 60 | Ht 64.0 in | Wt 158.0 lb

## 2012-07-12 DIAGNOSIS — I10 Essential (primary) hypertension: Secondary | ICD-10-CM

## 2012-07-12 DIAGNOSIS — I2510 Atherosclerotic heart disease of native coronary artery without angina pectoris: Secondary | ICD-10-CM

## 2012-07-12 NOTE — Progress Notes (Signed)
HPI The patient presents for followup after myocardial infarction earlier this year. He has a history of coronary artery disease as described below. He had his MI while traveling in [place] in March.    [name] [name] is a 52 y.o. male who returns for follow up after a recent MI in [place], [place]. He has a hx of CAD, s/p remote MI with bare-metal stent to the RCA and prior stenting to the CFX in 2000, ischemic cardiomyopathy with a previous EF of 35-40%. Last LHC 3/07: oDx 50-70%, mOM stent ok with 40% at prox edge, pRCA stent 95% ISR, L-R collats, EF 55%. He had PCI with Cypher DES to the RCA. Other hx includes HTN, HL and OSA. Last ETT 10/11: Normal. Lexiscan Myoview 12/12: low risk, small, reversible, mild mid anterior perfusion defect (cannot r/o ischemia but may be attenuation), EF 61%. Med Rx continued. Last seen in this office 12/12 for surgical clearance for low back surgery. Patient was in [place] for a business trip and developed sudden onset of CP. I have no records. He was seen at [facility]. By his description, he had a NSTEMI. He tells me he had PCI with stenting (not sure if DES or BMS) to an unknown vessel. He had a radial approach. He was switched from Plavix to Brilinta. He describes having an echo that demonstrated normal LVF. His hospital course was uncomplicated. Since d/c, no chest pain, dyspnea, orthopnea, PND, edema, syncope. He has noted some epistaxis and some scant BRBPR (on the tissue only). Otherwise, no frank bleeding.    No Known Allergies  Current Outpatient Prescriptions  Medication Sig Dispense Refill  . enalapril (VASOTEC) 20 MG tablet TAKE 1 TABLET TWICE DAILY *CALL OFFICE FOR APPOINTMENT  60 tablet  0  . lansoprazole (PREVACID) 30 MG capsule Take 30 mg by mouth daily.        . methocarbamol (ROBAXIN) 500 MG tablet Take 500 mg by mouth 3 (three) times daily.      . metoprolol (TOPROL-XL) 200 MG 24 hr tablet TAKE 1 TABLET BY MOUTH DAILY.  30  tablet  5  . nitroGLYCERIN (NITROSTAT) 0.3 MG SL tablet Place 0.3 mg under the tongue every 5 (five) minutes as needed for chest pain.      Marland Kitchen oxyCODONE-acetaminophen (PERCOCET) 7.5-325 MG per tablet Take 1 tablet by mouth every 4 (four) hours as needed for pain.      . rosuvastatin (CRESTOR) 20 MG tablet Take 1 tablet (20 mg total) by mouth daily.  90 tablet  3  . Ticagrelor (BRILINTA) 90 MG TABS tablet Take 90 mg by mouth 2 (two) times daily.       No current facility-administered medications for this visit.    Past Medical History  Diagnosis Date  . Coronary artery disease     h/o MI; s/p stent to RCA and CFX in 2000;  LHC 3/07: oDx 50-70%, mOM stent ok with 40% at prox edge, pRCA stent 95% ISR, L-R collats, EF 55%.  He had PCI with Cypher DES to the RCA;  s/p NSTEMI at [facility] [place] [facility] in [place] 3/14 => PCI (records pending)  . Sleep apnea   . Hyperlipidemia   . Hypertension   . Obesity   . Scoliosis   . DDD (degenerative disc disease)     Past Surgical History  Procedure Laterality Date  . Cardiac catheterization  04/25/2005    remote stenting of the circumflex artery and in 2000 had  a bare metal stent placed in the proximal right coronary artery  . Back surgery    . Back surgery    . Cardiac catheterization  02/21/2003    ROS:  As stated in the HPI and negative for all other systems.  PHYSICAL EXAM BP 100/70  Pulse 60  Ht 5\' 4"  (1.626 m)  Wt 158 lb (71.668 kg)  BMI 27.11 kg/m2 GENERAL:  Well appearing HEENT:  Pupils equal round and reactive, fundi not visualized, oral mucosa unremarkable NECK:  No jugular venous distention, waveform within normal limits, carotid upstroke brisk and symmetric, no bruits, no thyromegaly LYMPHATICS:  No cervical, inguinal adenopathy LUNGS:  Clear to auscultation bilaterally BACK:  No CVA tenderness CHEST:  Unremarkable HEART:  PMI not displaced or sustained,S1 and S2 within normal limits, no S3, no S4, no clicks, no rubs, no  murmurs ABD:  Flat, positive bowel sounds normal in frequency in pitch, no bruits, no rebound, no guarding, no midline pulsatile mass, no hepatomegaly, no splenomegaly EXT:  2 plus pulses throughout, no edema, no cyanosis no clubbing SKIN:  No rashes no nodules NEURO:  Cranial nerves II through XII grossly intact, motor grossly intact throughout PSYCH:  Cognitively intact, oriented to person place and time  EKG:  ASSESSMENT AND PLAN   1. CAD: Doing well since recent NSTEMI tx with PCI in [place]. Continue DAPT. Will request records. If he continues to have frequent epistaxis or hemorrhoidal bleeding, consider changing Brilinta to Plavix after 30 days post PCI. Will need to see records first. He is not interested in cardiac rehab. He will increase activity on his own. 2. Hyperlipidemia: With recent MI, would recommend high dose statin. Increase Crestor to 20 mg QD. He typically has labs checked with his PCP and has an appt in 3 mos. 3. Hypertension: Controlled. Continue current therapy.  4. Disposition: F/u with Dr. Rollene Rotunda in 4-6 weeks. We will request records from [place].  5.

## 2012-07-12 NOTE — Progress Notes (Signed)
HPI The patient presents for followup after myocardial infarction in March. He was traveling in [place]. I was able to review these records today. The disc is also available for review is having some trouble with his images. He had an occluded circumflex. This was treated with a drug-eluting stent. He had nonobstructive disease elsewhere. Thankfully his ejection fraction was only mildly reduced by echo. He says that since that time he's been doing very well. He's lost 25 pounds through diet and exercise. His walking routinely. He's not having any cardiovascular symptoms. The patient denies any new symptoms such as chest discomfort, neck or arm discomfort. There has been no new shortness of breath, PND or orthopnea. There have been no reported palpitations, presyncope or syncope.  No Known Allergies  Current Outpatient Prescriptions  Medication Sig Dispense Refill  . enalapril (VASOTEC) 20 MG tablet TAKE 1 TABLET TWICE DAILY *CALL OFFICE FOR APPOINTMENT  60 tablet  0  . lansoprazole (PREVACID) 30 MG capsule Take 30 mg by mouth daily.        . methocarbamol (ROBAXIN) 500 MG tablet Take 500 mg by mouth 3 (three) times daily.      . metoprolol (TOPROL-XL) 200 MG 24 hr tablet TAKE 1 TABLET BY MOUTH DAILY.  30 tablet  5  . nitroGLYCERIN (NITROSTAT) 0.3 MG SL tablet Place 0.3 mg under the tongue every 5 (five) minutes as needed for chest pain.      Marland Kitchen oxyCODONE-acetaminophen (PERCOCET) 7.5-325 MG per tablet Take 1 tablet by mouth every 4 (four) hours as needed for pain.      . rosuvastatin (CRESTOR) 20 MG tablet Take 1 tablet (20 mg total) by mouth daily.  90 tablet  3  . Ticagrelor (BRILINTA) 90 MG TABS tablet Take 90 mg by mouth 2 (two) times daily.       No current facility-administered medications for this visit.    Past Medical History  Diagnosis Date  . Coronary artery disease     h/o MI; s/p stent to RCA and CFX in 2000;  LHC 3/07: oDx 50-70%, mOM stent ok with 40% at prox edge, pRCA stent  95% ISR, L-R collats, EF 55%.  He had PCI with Cypher DES to the RCA;  s/p [facility]I at [facility] in [place] 3/14 => PCI (records pending)  . Sleep apnea   . Hyperlipidemia   . Hypertension   . Obesity   . Scoliosis   . DDD (degenerative disc disease)     Past Surgical History  Procedure Laterality Date  . Cardiac catheterization  04/25/2005    remote stenting of the circumflex artery and in 2000 had a bare metal stent placed in the proximal right coronary artery  . Back surgery    . Back surgery    . Cardiac catheterization  02/21/2003    ROS:  As stated in the HPI and negative for all other systems.  PHYSICAL EXAM BP 100/70  Pulse 60  Ht 5\' 4"  (1.626 m)  Wt 158 lb (71.668 kg)  BMI 27.11 kg/m2 GENERAL:  Well appearing NECK:  No jugular venous distention, waveform within normal limits, carotid upstroke brisk and symmetric, no bruits, no thyromegaly LYMPHATICS:  No cervical, inguinal adenopathy LUNGS:  Clear to auscultation bilaterally CHEST:  Unremarkable HEART:  PMI not displaced or sustained,S1 and S2 within normal limits, no S3, no S4, no clicks, no rubs, no murmurs ABD:  Flat, positive bowel sounds normal in frequency in pitch, no bruits, no rebound,  no guarding, no midline pulsatile mass, no hepatomegaly, no splenomegaly EXT:  2 plus pulses throughout, no edema, no cyanosis no clubbing SKIN:  No rashes no nodules  ASSESSMENT AND PLAN  CAD:  He is doing well since his last PCI.  He will remain on Brilinta.  He is taking an 81 mg ASA.  We discussed again the extreme importance of secondary risk reduction particularly in his situation.  LDL:  His LDL in [place] was 133.  However, he was only taking 10 mg of Crestor. This is not 20 mg and he will have his LDL followed by his primary provider. I would have a low threshold for 40 mg it is not at least an LDL of less than 100. His diet is better. We discussed this as well.   HTN:  The blood pressure is at target.  No change in medications is indicated. We will continue with therapeutic lifestyle changes (TLC).

## 2012-07-12 NOTE — Patient Instructions (Addendum)
The current medical regimen is effective;  continue present plan and medications.  Follow up in 6 months with Dr Hochrein.  You will receive a letter in the mail 2 months before you are due.  Please call us when you receive this letter to schedule your follow up appointment.  

## 2012-07-27 ENCOUNTER — Other Ambulatory Visit: Payer: Self-pay | Admitting: Cardiology

## 2012-08-07 ENCOUNTER — Encounter: Payer: Self-pay | Admitting: Cardiology

## 2012-09-12 ENCOUNTER — Other Ambulatory Visit: Payer: Self-pay | Admitting: Cardiology

## 2012-12-05 ENCOUNTER — Other Ambulatory Visit: Payer: Self-pay | Admitting: Physician Assistant

## 2013-04-05 ENCOUNTER — Ambulatory Visit (INDEPENDENT_AMBULATORY_CARE_PROVIDER_SITE_OTHER): Payer: BC Managed Care – PPO | Admitting: Cardiology

## 2013-04-05 ENCOUNTER — Encounter: Payer: Self-pay | Admitting: Cardiology

## 2013-04-05 VITALS — BP 124/86 | HR 56 | Ht 64.0 in | Wt 161.0 lb

## 2013-04-05 DIAGNOSIS — I10 Essential (primary) hypertension: Secondary | ICD-10-CM

## 2013-04-05 DIAGNOSIS — I2510 Atherosclerotic heart disease of native coronary artery without angina pectoris: Secondary | ICD-10-CM

## 2013-04-05 MED ORDER — CLOPIDOGREL BISULFATE 75 MG PO TABS: 75.0000 mg | ORAL_TABLET | Freq: Every day | ORAL | Status: DC

## 2013-04-05 NOTE — Patient Instructions (Signed)
Please stop your Brilinta and start Plavix 75 mg a day. Continue all other medications as listed.  Follow up in 6 months with Dr Antoine PocheHochrein.  You will receive a letter in the mail 2 months before you are due.  Please call us when you receive this letter to schedule your follow up appointment.

## 2013-04-05 NOTE — Progress Notes (Signed)
HPI The patient presents for followup after myocardial infarction last March while traveling in [place].  He had an occluded circumflex. This was treated with a drug-eluting stent. He had nonobstructive disease elsewhere. Thankfully his ejection fraction was only mildly reduced by echo.  Since I last saw him he has done well.  The patient denies any new symptoms such as chest discomfort, neck or arm discomfort. There has been no new shortness of breath, PND or orthopnea. There have been no reported palpitations, presyncope or syncope.  He has been walking for exercise and has had no further symptoms.    No Known Allergies  Current Outpatient Prescriptions  Medication Sig Dispense Refill  . aspirin 81 MG tablet Take 81 mg by mouth daily.      . enalapril (VASOTEC) 20 MG tablet Take 1 tablet (20 mg total) by mouth 2 (two) times daily.  60 tablet  5  . lansoprazole (PREVACID) 30 MG capsule Take 30 mg by mouth daily.        . methocarbamol (ROBAXIN) 500 MG tablet Take 500 mg by mouth 3 (three) times daily.      . metoprolol (TOPROL-XL) 200 MG 24 hr tablet TAKE 1 TABLET BY MOUTH DAILY.  30 tablet  6  . nitroGLYCERIN (NITROSTAT) 0.3 MG SL tablet Place 0.3 mg under the tongue every 5 (five) minutes as needed for chest pain.      Marland Kitchen. oxyCODONE-acetaminophen (PERCOCET) 7.5-325 MG per tablet Take 1 tablet by mouth every 4 (four) hours as needed for pain.      . rosuvastatin (CRESTOR) 20 MG tablet Take 1 tablet (20 mg total) by mouth daily.  90 tablet  3  . Ticagrelor (BRILINTA) 90 MG TABS tablet Take 90 mg by mouth 2 (two) times daily.       No current facility-administered medications for this visit.    Past Medical History  Diagnosis Date  . Coronary artery disease     h/o MI; s/p stent to RCA and CFX in 2000;  LHC 3/07: oDx 50-70%, mOM stent ok with 40% at prox edge, pRCA stent 95% ISR, L-R collats, EF 55%.  He had PCI with Cypher DES to the RCA;  s/p [facility] at [facility] in  [place] 3/14 => PCI of an occluded circumflex. This was without 2.5 x 28 mm DES. At that time he had RCA 50-60% in-stent restenosis. The LAD had 40-50% stenosis.   . Sleep apnea   . Hyperlipidemia   . Hypertension   . Obesity   . Scoliosis   . DDD (degenerative disc disease)     Past Surgical History  Procedure Laterality Date  . Back surgery      x 2    ROS:  As stated in the HPI and negative for all other systems.  PHYSICAL EXAM BP 124/86  Pulse 56  Ht 5\' 4"  (1.626 m)  Wt 161 lb (73.029 kg)  BMI 27.62 kg/m2 GENERAL:  Well appearing NECK:  No jugular venous distention, waveform within normal limits, carotid upstroke brisk and symmetric, no bruits, no thyromegaly LUNGS:  Clear to auscultation bilaterally CHEST:  Unremarkable HEART:  PMI not displaced or sustained,S1 and S2 within normal limits, no S3, no S4, no clicks, no rubs, no murmurs ABD:  Flat, positive bowel sounds normal in frequency in pitch, no bruits, no rebound, no guarding, no midline pulsatile mass, no hepatomegaly, no splenomegaly EXT:  2 plus pulses throughout, no edema, no cyanosis no clubbing  EKG:  Sinus rhythm, rate 56, axis within normal limits, intervals within normal limits, no acute ST-T wave changes.  04/05/2013   ASSESSMENT AND PLAN  CAD:  He is doing well since his last PCI.  I will switch him to Plavix and keep him on DAPT a for now given the fact that he was stented for an acute MI with occlusion, has residual nonobstructive disease and this was a repeat event. I will however see him in 6 months as involving data may suggest therapy should be Plavix alone.  LDL:  He will have this checked soon.  I would suggest a goal of LDL less than 70.     HTN:  The blood pressure is at target. No change in medications is indicated. We will continue with therapeutic lifestyle changes (TLC).

## 2013-04-23 ENCOUNTER — Other Ambulatory Visit: Payer: Self-pay | Admitting: Cardiology

## 2013-06-28 ENCOUNTER — Other Ambulatory Visit: Payer: Self-pay

## 2013-06-28 MED ORDER — METOPROLOL SUCCINATE ER 200 MG PO TB24: ORAL_TABLET | ORAL | Status: DC

## 2013-09-19 ENCOUNTER — Other Ambulatory Visit: Payer: Self-pay | Admitting: *Deleted

## 2013-09-19 MED ORDER — ENALAPRIL MALEATE 20 MG PO TABS: 20.0000 mg | ORAL_TABLET | Freq: Two times a day (BID) | ORAL | Status: DC

## 2014-02-06 ENCOUNTER — Other Ambulatory Visit: Payer: Self-pay | Admitting: Cardiology

## 2014-02-06 MED ORDER — METOPROLOL SUCCINATE ER 200 MG PO TB24: ORAL_TABLET | ORAL | Status: DC

## 2014-02-13 ENCOUNTER — Other Ambulatory Visit: Payer: Self-pay | Admitting: *Deleted

## 2014-02-13 MED ORDER — ENALAPRIL MALEATE 20 MG PO TABS: 20.0000 mg | ORAL_TABLET | Freq: Two times a day (BID) | ORAL | Status: DC

## 2014-03-12 ENCOUNTER — Other Ambulatory Visit: Payer: Self-pay | Admitting: *Deleted

## 2014-03-12 MED ORDER — METOPROLOL SUCCINATE ER 200 MG PO TB24: ORAL_TABLET | ORAL | Status: DC

## 2014-03-18 ENCOUNTER — Ambulatory Visit (INDEPENDENT_AMBULATORY_CARE_PROVIDER_SITE_OTHER): Payer: BLUE CROSS/BLUE SHIELD | Admitting: Cardiology

## 2014-03-18 ENCOUNTER — Telehealth: Payer: Self-pay | Admitting: *Deleted

## 2014-03-18 ENCOUNTER — Encounter: Payer: Self-pay | Admitting: Cardiology

## 2014-03-18 ENCOUNTER — Other Ambulatory Visit: Payer: Self-pay | Admitting: *Deleted

## 2014-03-18 VITALS — BP 140/90 | HR 81 | Ht 64.0 in | Wt 159.4 lb

## 2014-03-18 DIAGNOSIS — G4730 Sleep apnea, unspecified: Secondary | ICD-10-CM

## 2014-03-18 DIAGNOSIS — I10 Essential (primary) hypertension: Secondary | ICD-10-CM

## 2014-03-18 DIAGNOSIS — I2510 Atherosclerotic heart disease of native coronary artery without angina pectoris: Secondary | ICD-10-CM

## 2014-03-18 MED ORDER — ROSUVASTATIN CALCIUM 20 MG PO TABS: 20.0000 mg | ORAL_TABLET | Freq: Every day | ORAL | Status: DC

## 2014-03-18 NOTE — Patient Instructions (Signed)
We are setting you up with Dr. Tresa EndoKelly for sleep evaluation  Your physician recommends that you schedule a follow-up appointment in: one year with Dr. Antoine PocheHochrein

## 2014-03-18 NOTE — Progress Notes (Signed)
HPI The patient presents for followup after myocardial infarction last March 2014 while traveling in [place].  He had an occluded circumflex. This was treated with a drug-eluting stent. He had nonobstructive disease elsewhere. Thankfully his ejection fraction was only mildly reduced by echo.   He is walking daily.The patient denies any new symptoms such as chest discomfort, neck or arm discomfort. There has been no new shortness of breath, PND or orthopnea. There have been no reported palpitations, presyncope or syncope.   No Known Allergies  Current Outpatient Prescriptions  Medication Sig Dispense Refill  . clopidogrel (PLAVIX) 75 MG tablet Take 1 tablet (75 mg total) by mouth daily. 90 tablet 3  . enalapril (VASOTEC) 20 MG tablet Take 1 tablet (20 mg total) by mouth 2 (two) times daily. 60 tablet 2  . lansoprazole (PREVACID) 30 MG capsule Take 30 mg by mouth daily.      . methocarbamol (ROBAXIN) 500 MG tablet Take 500 mg by mouth 3 (three) times daily.    . metoprolol (TOPROL-XL) 200 MG 24 hr tablet TAKE 1 TABLET BY MOUTH DAILY. 30 tablet 0  . nitroGLYCERIN (NITROSTAT) 0.3 MG SL tablet Place 0.3 mg under the tongue every 5 (five) minutes as needed for chest pain.    Marland Kitchen. oxyCODONE-acetaminophen (PERCOCET) 7.5-325 MG per tablet Take 1 tablet by mouth every 4 (four) hours as needed for pain.    . rosuvastatin (CRESTOR) 20 MG tablet Take 1 tablet (20 mg total) by mouth daily. 90 tablet 3   No current facility-administered medications for this visit.    Past Medical History  Diagnosis Date  . Coronary artery disease     h/o MI; s/p stent to RCA and CFX in 2000;  LHC 3/07: oDx 50-70%, mOM stent ok with 40% at prox edge, pRCA stent 95% ISR, L-R collats, EF 55%.  He had PCI with Cypher DES to the RCA;  s/p [facility] [place] 3[place] => PCI of an occluded circumflex. This was without 2.5 x 28 mm DES. At that time he had RCA 50-60% in-stent restenosis. The LAD had 40-50%  stenosis.   . Sleep apnea   . Hyperlipidemia   . Hypertension   . Obesity   . Scoliosis   . DDD (degenerative disc disease)     Past Surgical History  Procedure Laterality Date  . Back surgery      x 2    ROS:  Cold symptoms.  Otherwise as stated in the HPI and negative for all other systems.  PHYSICAL EXAM BP 140/90 mmHg  Pulse 81  Ht 5\' 4"  (1.626 m)  Wt 159 lb 6.4 oz (72.303 kg)  BMI 27.35 kg/m2 GENERAL:  Well appearing NECK:  No jugular venous distention, waveform within normal limits, carotid upstroke brisk and symmetric, no bruits, no thyromegaly LUNGS:  Clear to auscultation bilaterally CHEST:  Unremarkable HEART:  PMI not displaced or sustained,S1 and S2 within normal limits, no S3, no S4, no clicks, no rubs, no murmurs ABD:  Flat, positive bowel sounds normal in frequency in pitch, no bruits, no rebound, no guarding, no midline pulsatile mass, no hepatomegaly, no splenomegaly EXT:  2 plus pulses throughout, no edema, no cyanosis no clubbing   EKG:  Sinus rhythm, rate 81, axis within normal limits, intervals within normal limits, no acute ST-T wave changes.  03/18/2014   ASSESSMENT AND PLAN  CAD:  He  Is doing well. He's been taking Plavix only. He will continue with  this. No change in therapy is indicated.  LDL:  He will have this checked soon.  I would suggest a goal of LDL less than 70.     HTN:  The blood pressure is  Mildly elevated.Marland Kitchen No change in medications is indicated. We will continue with therapeutic lifestyle changes (TLC).   I would like him to try to get his sleep apnea better addressed.   I will set him up with an appointment to see Dr. Tresa Endo

## 2014-03-18 NOTE — Telephone Encounter (Signed)
°  1. Which medications need to be refilled? Crestor  2. Which pharmacy is medication to be sent to?[facility] in [place] on [address]  3. Do they need a 30 day or 90 day supply?   4. Would they like a call back once the medication has been sent to the pharmacy? No  HE FORGOT TO GET THE PRESCRIPTION WHEN HE WAS IN THE OFFICE

## 2014-03-18 NOTE — Telephone Encounter (Signed)
Spoke with pt, aware script sent to the pharmacy 

## 2014-03-19 ENCOUNTER — Ambulatory Visit: Payer: BC Managed Care – PPO | Admitting: Cardiology

## 2014-04-08 ENCOUNTER — Other Ambulatory Visit: Payer: Self-pay | Admitting: Cardiology

## 2014-05-08 ENCOUNTER — Other Ambulatory Visit: Payer: Self-pay

## 2014-05-08 MED ORDER — ENALAPRIL MALEATE 20 MG PO TABS: 20.0000 mg | ORAL_TABLET | Freq: Two times a day (BID) | ORAL | Status: DC

## 2014-05-19 ENCOUNTER — Ambulatory Visit: Payer: BLUE CROSS/BLUE SHIELD | Admitting: Cardiovascular Disease

## 2014-06-30 ENCOUNTER — Telehealth: Payer: Self-pay | Admitting: Cardiology

## 2014-06-30 NOTE — Telephone Encounter (Signed)
Pt stated was instructed to call if BPs ran higher than ~140s systolic. Reports past several days he has noted BP systolic of 150-160.   Today had BP of 170/100. HR of 60. He is largely asymptomatic w/ this, denies dizziness, nausea, other symptoms. Notes sensation of ear ringing.  Informed pt I would defer to Dr. Antoine PocheHochrein for instruction on med changes - keep on current doses for now. Gave instructions on what to do for hypertensive urgency.  Routing to Dr. Antoine PocheHochrein.   ------------------------------------------------- Note to return caller: Pt prefers to be called at work number during day - [phone] - does not get cell service at work. States OK to leave message on that machine.

## 2014-06-30 NOTE — Telephone Encounter (Signed)
Please call,blood pressure is running higher,Dr Hochrien had told him to call if it got higher.

## 2014-06-30 NOTE — Telephone Encounter (Signed)
No answer, VM box not set up.

## 2014-07-01 NOTE — Telephone Encounter (Signed)
Start chlorthalidone 12.5 mg one po daily.  Disp number 90 with 3 refills.

## 2014-07-02 ENCOUNTER — Other Ambulatory Visit: Payer: Self-pay | Admitting: *Deleted

## 2014-07-02 MED ORDER — CHLORTHALIDONE 25 MG PO TABS: 12.5000 mg | ORAL_TABLET | Freq: Every day | ORAL | Status: DC

## 2014-07-02 NOTE — Telephone Encounter (Signed)
Med called into pharmacy , and pt. Called and informed

## 2014-09-10 ENCOUNTER — Encounter: Payer: Self-pay | Admitting: Cardiology

## 2014-10-07 ENCOUNTER — Ambulatory Visit: Payer: BLUE CROSS/BLUE SHIELD | Admitting: Cardiology

## 2014-10-13 ENCOUNTER — Other Ambulatory Visit: Payer: Self-pay | Admitting: *Deleted

## 2014-10-13 MED ORDER — ENALAPRIL MALEATE 20 MG PO TABS: 20.0000 mg | ORAL_TABLET | Freq: Two times a day (BID) | ORAL | Status: DC

## 2014-10-13 MED ORDER — METOPROLOL SUCCINATE ER 200 MG PO TB24: 200.0000 mg | ORAL_TABLET | Freq: Every day | ORAL | Status: DC

## 2014-11-20 ENCOUNTER — Ambulatory Visit: Payer: BLUE CROSS/BLUE SHIELD | Admitting: Cardiology

## 2015-01-22 ENCOUNTER — Emergency Department (HOSPITAL_COMMUNITY): Payer: BLUE CROSS/BLUE SHIELD

## 2015-01-22 ENCOUNTER — Inpatient Hospital Stay (HOSPITAL_COMMUNITY)
Admission: EM | Admit: 2015-01-22 | Discharge: 2015-01-24 | DRG: 247 | Disposition: A | Discharge status: Home / Self Care | Payer: BLUE CROSS/BLUE SHIELD | Attending: Internal Medicine | Admitting: Internal Medicine

## 2015-01-22 ENCOUNTER — Encounter (HOSPITAL_COMMUNITY): Payer: Self-pay | Admitting: Emergency Medicine

## 2015-01-22 DIAGNOSIS — K219 Gastro-esophageal reflux disease without esophagitis: Secondary | ICD-10-CM | POA: Diagnosis present

## 2015-01-22 DIAGNOSIS — I25110 Atherosclerotic heart disease of native coronary artery with unstable angina pectoris: Secondary | ICD-10-CM | POA: Diagnosis not present

## 2015-01-22 DIAGNOSIS — E785 Hyperlipidemia, unspecified: Secondary | ICD-10-CM | POA: Diagnosis present

## 2015-01-22 DIAGNOSIS — I272 Other secondary pulmonary hypertension: Secondary | ICD-10-CM | POA: Diagnosis present

## 2015-01-22 DIAGNOSIS — R079 Chest pain, unspecified: Secondary | ICD-10-CM | POA: Diagnosis present

## 2015-01-22 DIAGNOSIS — Z981 Arthrodesis status: Secondary | ICD-10-CM

## 2015-01-22 DIAGNOSIS — I340 Nonrheumatic mitral (valve) insufficiency: Secondary | ICD-10-CM | POA: Diagnosis present

## 2015-01-22 DIAGNOSIS — M19011 Primary osteoarthritis, right shoulder: Secondary | ICD-10-CM | POA: Diagnosis present

## 2015-01-22 DIAGNOSIS — Z955 Presence of coronary angioplasty implant and graft: Secondary | ICD-10-CM

## 2015-01-22 DIAGNOSIS — M19012 Primary osteoarthritis, left shoulder: Secondary | ICD-10-CM | POA: Diagnosis present

## 2015-01-22 DIAGNOSIS — I200 Unstable angina: Secondary | ICD-10-CM | POA: Insufficient documentation

## 2015-01-22 DIAGNOSIS — I110 Hypertensive heart disease with heart failure: Secondary | ICD-10-CM | POA: Insufficient documentation

## 2015-01-22 DIAGNOSIS — Z79899 Other long term (current) drug therapy: Secondary | ICD-10-CM

## 2015-01-22 DIAGNOSIS — I10 Essential (primary) hypertension: Secondary | ICD-10-CM | POA: Diagnosis present

## 2015-01-22 DIAGNOSIS — I252 Old myocardial infarction: Secondary | ICD-10-CM

## 2015-01-22 DIAGNOSIS — G4730 Sleep apnea, unspecified: Secondary | ICD-10-CM | POA: Diagnosis present

## 2015-01-22 DIAGNOSIS — I509 Heart failure, unspecified: Secondary | ICD-10-CM | POA: Diagnosis present

## 2015-01-22 DIAGNOSIS — Z7902 Long term (current) use of antithrombotics/antiplatelets: Secondary | ICD-10-CM

## 2015-01-22 DIAGNOSIS — I255 Ischemic cardiomyopathy: Secondary | ICD-10-CM | POA: Diagnosis present

## 2015-01-22 HISTORY — DX: Sciatica, unspecified side: M54.30

## 2015-01-22 HISTORY — DX: Personal history of other medical treatment: Z92.89

## 2015-01-22 HISTORY — DX: Gastro-esophageal reflux disease without esophagitis: K21.9

## 2015-01-22 HISTORY — DX: Low back pain, unspecified: M54.50

## 2015-01-22 HISTORY — DX: Other chronic pain: G89.29

## 2015-01-22 HISTORY — DX: Low back pain: M54.5

## 2015-01-22 LAB — CBC
HCT: 41.1 % (ref 39.0–52.0)
Hemoglobin: 14.6 g/dL (ref 13.0–17.0)
MCH: 30.2 pg (ref 26.0–34.0)
MCHC: 35.5 g/dL (ref 30.0–36.0)
MCV: 85.1 fL (ref 78.0–100.0)
Platelets: 255 10*3/uL (ref 150–400)
RBC: 4.83 MIL/uL (ref 4.22–5.81)
RDW: 12.8 % (ref 11.5–15.5)
WBC: 9.3 10*3/uL (ref 4.0–10.5)

## 2015-01-22 LAB — BASIC METABOLIC PANEL
Anion gap: 8 (ref 5–15)
BUN: 13 mg/dL (ref 6–20)
CO2: 31 mmol/L (ref 22–32)
Calcium: 9.1 mg/dL (ref 8.9–10.3)
Chloride: 96 mmol/L — ABNORMAL LOW (ref 101–111)
Creatinine, Ser: 1.23 mg/dL (ref 0.61–1.24)
GFR calc Af Amer: >60 mL/min (ref >60)
GFR calc non Af Amer: >60 mL/min (ref >60)
Glucose, Bld: 144 mg/dL — ABNORMAL HIGH (ref 65–99)
Potassium: 3.5 mmol/L (ref 3.5–5.1)
Sodium: 135 mmol/L (ref 135–145)

## 2015-01-22 LAB — I-STAT TROPONIN, ED: Troponin i, poc: 0.02 ng/mL (ref 0.00–0.08)

## 2015-01-22 MED ORDER — ASPIRIN 81 MG PO CHEW
324.0000 mg | CHEWABLE_TABLET | Freq: Once | ORAL | Status: AC
  Administered 2015-01-22: 324 mg via ORAL
  Filled 2015-01-22: qty 4

## 2015-01-22 NOTE — ED Notes (Signed)
Pt states for the last week he has had intermittent chest pain that has been coming and going and worse on exertion. Tonight around 600pm he got up and was walking and center of chest became very tight and got short of breath.

## 2015-01-22 NOTE — ED Provider Notes (Signed)
CSN: [id number]     Arrival date & time 01/22/15  1916 History   First MD Initiated Contact with Patient 01/22/15 2043     Chief Complaint  Patient presents with  . Chest Pain    Patient is a 54 y.o. male presenting with chest pain. The history is provided by the patient.  Chest Pain Pain location:  Substernal area Pain quality: pressure   Pain radiates to:  L arm Pain radiates to the back: no   Pain severity:  Moderate Onset quality:  Sudden Timing:  Constant Progression:  Resolved Chronicity:  New Context comment:  Exertion - walking Relieved by:  Rest Associated symptoms: no abdominal pain, no back pain, no cough, no fever, no headache, no nausea, no shortness of breath, no syncope and not vomiting   Risk factors: coronary artery disease, high cholesterol, hypertension and male sex     Past Medical History  Diagnosis Date  . Coronary artery disease     h/o MI; s/p stent to RCA and CFX in 2000;  LHC 3/07: oDx 50-70%, mOM stent ok with 40% at prox edge, pRCA stent 95% ISR, L-R collats, EF 55%.  He had PCI with Cypher DES to the RCA;  s/p NSTEMI at [facility] in [place] 3/14 => PCI of an occluded circumflex. This was without 2.5 x 28 mm DES. At that time he had RCA 50-60% in-stent restenosis. The LAD had 40-50% stenosis.   . Hyperlipidemia   . Hypertension   . Obesity   . Scoliosis   . CHF (congestive heart failure) (HCC) dx'd 2006  . Myocardial infarction (HCC) < 2006 X 1; 2014    "both mild; minimal, if any damage"  . Sleep apnea     "years ago; did wear mask" (01/22/2015)  . History of blood transfusion 1978    "when I had Harrington rod placement"  . GERD (gastroesophageal reflux disease)   . DDD (degenerative disc disease)   . Arthritis     "shoulders" (01/22/2015)  . Sciatic leg pain     "left side"  . Chronic lower back pain    Past Surgical History  Procedure Laterality Date  . Tonsillectomy    . Spine surgery  1978    "Harrington rod  placement"  . Back surgery    . Lumbar disc surgery      1990's?  . Cardiac catheterization  2006  . Coronary angioplasty with stent placement  ~ 2001 - 04/2012 X 4    "I've had a total of 4 placed; one is inside another" (01/22/2015)   Family History  Problem Relation Age of Onset  . Heart disease Father   . Sleep apnea Father   . Atrial fibrillation Father   . Alcohol abuse Father    Social History  Substance Use Topics  . Smoking status: Never Smoker   . Smokeless tobacco: Never Used  . Alcohol Use: Yes     Comment: 01/22/2015 "might have a few beers/year"    Review of Systems  Constitutional: Negative for fever and chills.  HENT: Negative for rhinorrhea and sore throat.   Eyes: Negative for visual disturbance.  Respiratory: Negative for cough and shortness of breath.   Cardiovascular: Positive for chest pain. Negative for syncope.  Gastrointestinal: Negative for nausea, vomiting, abdominal pain, diarrhea and constipation.  Genitourinary: Negative for dysuria and hematuria.  Musculoskeletal: Negative for back pain and neck pain.  Skin: Negative for rash.  Neurological: Negative for syncope and  headaches.  Psychiatric/Behavioral: Negative for confusion.  All other systems reviewed and are negative.     Allergies  Review of patient's allergies indicates no known allergies.  Home Medications   Prior to Admission medications   Medication Sig Start Date End Date Taking? Authorizing Provider  chlorthalidone (HYGROTON) 25 MG tablet Take 0.5 tablets (12.5 mg total) by mouth daily. 07/02/14  Yes Rollene Rotunda, MD  clopidogrel (PLAVIX) 75 MG tablet Take 1 tablet (75 mg total) by mouth daily. 04/05/13  Yes Rollene Rotunda, MD  enalapril (VASOTEC) 20 MG tablet Take 1 tablet (20 mg total) by mouth 2 (two) times daily. 10/13/14  Yes Rollene Rotunda, MD  Ibuprofen-Diphenhydramine Cit (ADVIL PM PO) Take 2 tablets by mouth daily as needed (sleep).   Yes Historical Provider, MD   lansoprazole (PREVACID) 30 MG capsule Take 30 mg by mouth daily.     Yes Historical Provider, MD  methocarbamol (ROBAXIN) 500 MG tablet Take 500 mg by mouth 3 (three) times daily.   Yes Historical Provider, MD  metoprolol (TOPROL-XL) 200 MG 24 hr tablet Take 1 tablet (200 mg total) by mouth daily. 10/13/14  Yes Rollene Rotunda, MD  nitroGLYCERIN (NITROSTAT) 0.3 MG SL tablet Place 0.3 mg under the tongue every 5 (five) minutes as needed for chest pain.   Yes Historical Provider, MD  oxyCODONE-acetaminophen (PERCOCET) 7.5-325 MG tablet Take 1 tablet by mouth every 6 (six) hours as needed for moderate pain.  03/21/15  Yes Historical Provider, MD  rosuvastatin (CRESTOR) 20 MG tablet Take 1 tablet (20 mg total) by mouth daily. 03/18/14  Yes Rollene Rotunda, MD   BP 119/81 mmHg  Pulse 89  Temp(Src) 97.8 F (36.6 C) (Oral)  Resp 18  Ht  (1.626 m)  Wt 77.656 kg  BMI 29.37 kg/m2  SpO2 98% Physical Exam  Constitutional: He is oriented to person, place, and time. He appears well-developed and well-nourished. No distress.  HENT:  Head: Normocephalic and atraumatic.  Mouth/Throat: Oropharynx is clear and moist.  Eyes: EOM are normal.  Neck: Neck supple. No JVD present.  Cardiovascular: Normal rate, regular rhythm, normal heart sounds and intact distal pulses.   Pulmonary/Chest: Effort normal and breath sounds normal.  Abdominal: Soft. He exhibits no distension. There is no tenderness.  Musculoskeletal: Normal range of motion. He exhibits no edema.  Neurological: He is alert and oriented to person, place, and time. No cranial nerve deficit.  Skin: Skin is warm and dry.  Psychiatric: His behavior is normal.    ED Course  Procedures  None   Labs Review Labs Reviewed  BASIC METABOLIC PANEL - Abnormal; Notable for the following:    Chloride 96 (*)    Glucose, Bld 144 (*)    All other components within normal limits  CBC  I-STAT TROPOININ, ED    Imaging Review Dg Chest 2 View  01/22/2015   CLINICAL DATA:  Chest tightness and pain for several weeks, worse today. EXAM: CHEST  2 VIEW COMPARISON:  04/22/2005. FINDINGS: Trachea is midline. Heart size normal. Lungs are clear. No pleural fluid. A single Harrington rod traverses the thoracolumbar spine. Flowing anterior osteophytosis in the thoracic spine. IMPRESSION: No acute findings. Electronically Signed   By: Leanna Battles M.D.   On: 01/22/2015 20:12   I have personally reviewed and evaluated these images and lab results as part of my medical decision-making.   EKG Interpretation   Date/Time:  Thursday January 22 2015 19:22:42 EST Ventricular Rate:  91 PR Interval:  140 QRS Duration: 94 QT Interval:  358 QTC Calculation: 440 R Axis:   -32 Text Interpretation:  Normal sinus rhythm Left axis deviation Abnormal ECG  Confirmed by Juleen ChinaKOHUT  MD, STEPHEN ([id number]) on 01/22/2015 9:15:16 PM      MDM   Final diagnoses:  Chest pain, unspecified chest pain type    Patient is a 54 year old male with history of coronary disease, MI with multiple stents on Plavix who presents with exertional chest pain similar to prior. Patient states that the pain is the same as when he had his previous MI. Troponin negative. No concerning EKG changes. Chest x-ray unremarkable. Currently pain free now. Patient took aspirin prior to arrival. Concern for this history and will admit for ACS rule out.  Discussed with Dr. Juleen ChinaKohut.  Maris BergerJonah Adelfo Diebel, MD 01/22/15 2350  Raeford RazorStephen Kohut, MD 01/24/15 [phone]

## 2015-01-23 ENCOUNTER — Encounter (HOSPITAL_COMMUNITY): Payer: Self-pay | Admitting: Cardiovascular Disease

## 2015-01-23 ENCOUNTER — Encounter (HOSPITAL_COMMUNITY)
Admission: EM | Disposition: A | Discharge status: Home / Self Care | Payer: BLUE CROSS/BLUE SHIELD | Source: Home / Self Care | Attending: Internal Medicine

## 2015-01-23 DIAGNOSIS — K219 Gastro-esophageal reflux disease without esophagitis: Secondary | ICD-10-CM | POA: Diagnosis present

## 2015-01-23 DIAGNOSIS — I200 Unstable angina: Secondary | ICD-10-CM | POA: Insufficient documentation

## 2015-01-23 DIAGNOSIS — I255 Ischemic cardiomyopathy: Secondary | ICD-10-CM | POA: Diagnosis present

## 2015-01-23 DIAGNOSIS — I25110 Atherosclerotic heart disease of native coronary artery with unstable angina pectoris: Secondary | ICD-10-CM | POA: Diagnosis present

## 2015-01-23 DIAGNOSIS — Z955 Presence of coronary angioplasty implant and graft: Secondary | ICD-10-CM | POA: Diagnosis not present

## 2015-01-23 DIAGNOSIS — E785 Hyperlipidemia, unspecified: Secondary | ICD-10-CM | POA: Diagnosis not present

## 2015-01-23 DIAGNOSIS — Z79899 Other long term (current) drug therapy: Secondary | ICD-10-CM | POA: Diagnosis not present

## 2015-01-23 DIAGNOSIS — G4730 Sleep apnea, unspecified: Secondary | ICD-10-CM | POA: Diagnosis present

## 2015-01-23 DIAGNOSIS — I25119 Atherosclerotic heart disease of native coronary artery with unspecified angina pectoris: Secondary | ICD-10-CM | POA: Diagnosis not present

## 2015-01-23 DIAGNOSIS — I252 Old myocardial infarction: Secondary | ICD-10-CM | POA: Diagnosis not present

## 2015-01-23 DIAGNOSIS — I340 Nonrheumatic mitral (valve) insufficiency: Secondary | ICD-10-CM | POA: Diagnosis present

## 2015-01-23 DIAGNOSIS — R079 Chest pain, unspecified: Secondary | ICD-10-CM | POA: Diagnosis present

## 2015-01-23 DIAGNOSIS — Z981 Arthrodesis status: Secondary | ICD-10-CM | POA: Diagnosis not present

## 2015-01-23 DIAGNOSIS — I209 Angina pectoris, unspecified: Secondary | ICD-10-CM | POA: Diagnosis not present

## 2015-01-23 DIAGNOSIS — I10 Essential (primary) hypertension: Secondary | ICD-10-CM | POA: Diagnosis not present

## 2015-01-23 DIAGNOSIS — Z7902 Long term (current) use of antithrombotics/antiplatelets: Secondary | ICD-10-CM | POA: Diagnosis not present

## 2015-01-23 DIAGNOSIS — I272 Other secondary pulmonary hypertension: Secondary | ICD-10-CM | POA: Diagnosis present

## 2015-01-23 DIAGNOSIS — M19012 Primary osteoarthritis, left shoulder: Secondary | ICD-10-CM | POA: Diagnosis present

## 2015-01-23 DIAGNOSIS — M19011 Primary osteoarthritis, right shoulder: Secondary | ICD-10-CM | POA: Diagnosis present

## 2015-01-23 DIAGNOSIS — I110 Hypertensive heart disease with heart failure: Secondary | ICD-10-CM | POA: Insufficient documentation

## 2015-01-23 DIAGNOSIS — I509 Heart failure, unspecified: Secondary | ICD-10-CM | POA: Diagnosis present

## 2015-01-23 HISTORY — PX: CARDIAC CATHETERIZATION: SHX172

## 2015-01-23 LAB — PROTIME-INR
INR: 1.1 (ref 0.00–1.49)
PROTHROMBIN TIME: 14.4 s (ref 11.6–15.2)

## 2015-01-23 LAB — MAGNESIUM: Magnesium: 1.8 mg/dL (ref 1.7–2.4)

## 2015-01-23 LAB — LIPID PANEL
Cholesterol: 162 mg/dL (ref 0–200)
HDL: 24 mg/dL — ABNORMAL LOW (ref >40)
LDL CALC: UNDETERMINED mg/dL (ref 0–99)
Total CHOL/HDL Ratio: 6.8 RATIO
Triglycerides: 549 mg/dL — ABNORMAL HIGH (ref <150)
VLDL: UNDETERMINED mg/dL (ref 0–40)

## 2015-01-23 LAB — TROPONIN I: Troponin I: <0.03 ng/mL (ref <0.031)

## 2015-01-23 LAB — BASIC METABOLIC PANEL
Anion gap: 8 (ref 5–15)
BUN: 10 mg/dL (ref 6–20)
CALCIUM: 9.2 mg/dL (ref 8.9–10.3)
CO2: 34 mmol/L — AB (ref 22–32)
CREATININE: 0.91 mg/dL (ref 0.61–1.24)
Chloride: 97 mmol/L — ABNORMAL LOW (ref 101–111)
GFR calc non Af Amer: >60 mL/min (ref >60)
GLUCOSE: 130 mg/dL — AB (ref 65–99)
Potassium: 3.6 mmol/L (ref 3.5–5.1)
Sodium: 139 mmol/L (ref 135–145)

## 2015-01-23 LAB — CREATININE, SERUM
CREATININE: 0.95 mg/dL (ref 0.61–1.24)
GFR calc Af Amer: >60 mL/min (ref >60)
GFR calc non Af Amer: >60 mL/min (ref >60)

## 2015-01-23 LAB — CBC
HCT: 37.8 % — ABNORMAL LOW (ref 39.0–52.0)
Hemoglobin: 13.5 g/dL (ref 13.0–17.0)
MCH: 30.2 pg (ref 26.0–34.0)
MCHC: 35.7 g/dL (ref 30.0–36.0)
MCV: 84.6 fL (ref 78.0–100.0)
PLATELETS: 210 10*3/uL (ref 150–400)
RBC: 4.47 MIL/uL (ref 4.22–5.81)
RDW: 12.7 % (ref 11.5–15.5)
WBC: 8.3 10*3/uL (ref 4.0–10.5)

## 2015-01-23 LAB — TSH: TSH: 1.537 u[IU]/mL (ref 0.350–4.500)

## 2015-01-23 LAB — BRAIN NATRIURETIC PEPTIDE: B Natriuretic Peptide: 33.6 pg/mL (ref 0.0–100.0)

## 2015-01-23 LAB — POCT ACTIVATED CLOTTING TIME: ACTIVATED CLOTTING TIME: 296 s

## 2015-01-23 SURGERY — LEFT HEART CATH AND CORONARY ANGIOGRAPHY
Anesthesia: LOCAL

## 2015-01-23 MED ORDER — OXYCODONE-ACETAMINOPHEN 5-325 MG PO TABS
ORAL_TABLET | ORAL | Status: AC
  Filled 2015-01-23: qty 1

## 2015-01-23 MED ORDER — SODIUM CHLORIDE 0.9 % IJ SOLN: 3.0000 mL | INTRAMUSCULAR | Status: DC | PRN

## 2015-01-23 MED ORDER — SODIUM CHLORIDE 0.9 % IV SOLN: 250.0000 mL | INTRAVENOUS | Status: DC | PRN

## 2015-01-23 MED ORDER — PANTOPRAZOLE SODIUM 40 MG PO TBEC
40.0000 mg | DELAYED_RELEASE_TABLET | Freq: Every day | ORAL | Status: DC
  Administered 2015-01-23 – 2015-01-24 (×3): 40 mg via ORAL
  Filled 2015-01-23 (×4): qty 1

## 2015-01-23 MED ORDER — ROSUVASTATIN CALCIUM 20 MG PO TABS
20.0000 mg | ORAL_TABLET | Freq: Every day | ORAL | Status: DC
  Administered 2015-01-23 (×2): 20 mg via ORAL
  Filled 2015-01-23 (×2): qty 1

## 2015-01-23 MED ORDER — ACETAMINOPHEN 325 MG PO TABS: 650.0000 mg | ORAL_TABLET | ORAL | Status: DC | PRN

## 2015-01-23 MED ORDER — PANTOPRAZOLE SODIUM 40 MG PO TBEC: 40.0000 mg | DELAYED_RELEASE_TABLET | Freq: Every day | ORAL | Status: DC

## 2015-01-23 MED ORDER — HEPARIN SODIUM (PORCINE) 5000 UNIT/ML IJ SOLN
5000.0000 [IU] | Freq: Three times a day (TID) | INTRAMUSCULAR | Status: DC
  Filled 2015-01-23: qty 1

## 2015-01-23 MED ORDER — MIDAZOLAM HCL 2 MG/2ML IJ SOLN
INTRAMUSCULAR | Status: DC | PRN
  Administered 2015-01-23 (×2): 1 mg via INTRAVENOUS
  Administered 2015-01-23: 2 mg via INTRAVENOUS

## 2015-01-23 MED ORDER — OXYCODONE-ACETAMINOPHEN 5-325 MG PO TABS
2.0000 | ORAL_TABLET | Freq: Four times a day (QID) | ORAL | Status: DC | PRN
  Administered 2015-01-23: 19:00:00 2 via ORAL
  Filled 2015-01-23: qty 2

## 2015-01-23 MED ORDER — CLOPIDOGREL BISULFATE 300 MG PO TABS
ORAL_TABLET | ORAL | Status: AC
  Filled 2015-01-23: qty 1

## 2015-01-23 MED ORDER — ENALAPRIL MALEATE 20 MG PO TABS
20.0000 mg | ORAL_TABLET | Freq: Two times a day (BID) | ORAL | Status: DC
  Administered 2015-01-23 – 2015-01-24 (×2): 20 mg via ORAL
  Filled 2015-01-23 (×5): qty 1

## 2015-01-23 MED ORDER — HEPARIN SODIUM (PORCINE) 1000 UNIT/ML IJ SOLN
INTRAMUSCULAR | Status: DC | PRN
  Administered 2015-01-23: 6000 [IU] via INTRAVENOUS
  Administered 2015-01-23: 4000 [IU] via INTRAVENOUS

## 2015-01-23 MED ORDER — SODIUM CHLORIDE 0.9 % IJ SOLN: 3.0000 mL | Freq: Two times a day (BID) | INTRAMUSCULAR | Status: DC

## 2015-01-23 MED ORDER — METOPROLOL SUCCINATE ER 100 MG PO TB24: 200.0000 mg | ORAL_TABLET | Freq: Every day | ORAL | Status: DC

## 2015-01-23 MED ORDER — FAMOTIDINE IN NACL 20-0.9 MG/50ML-% IV SOLN
INTRAVENOUS | Status: DC | PRN
  Administered 2015-01-23: 20 mg via INTRAVENOUS

## 2015-01-23 MED ORDER — CHLORTHALIDONE 25 MG PO TABS: 12.5000 mg | ORAL_TABLET | Freq: Every day | ORAL | Status: DC

## 2015-01-23 MED ORDER — ENALAPRIL MALEATE 20 MG PO TABS: 20.0000 mg | ORAL_TABLET | Freq: Two times a day (BID) | ORAL | Status: DC

## 2015-01-23 MED ORDER — ASPIRIN EC 325 MG PO TBEC: 325.0000 mg | DELAYED_RELEASE_TABLET | Freq: Every day | ORAL | Status: DC

## 2015-01-23 MED ORDER — MORPHINE SULFATE (PF) 2 MG/ML IV SOLN
INTRAVENOUS | Status: AC
  Filled 2015-01-23: qty 1

## 2015-01-23 MED ORDER — OXYCODONE-ACETAMINOPHEN 7.5-325 MG PO TABS: 1.0000 | ORAL_TABLET | Freq: Four times a day (QID) | ORAL | Status: DC | PRN

## 2015-01-23 MED ORDER — MIDAZOLAM HCL 2 MG/2ML IJ SOLN
INTRAMUSCULAR | Status: AC
  Filled 2015-01-23: qty 2

## 2015-01-23 MED ORDER — HEPARIN (PORCINE) IN NACL 2-0.9 UNIT/ML-% IJ SOLN
INTRAMUSCULAR | Status: AC
  Filled 2015-01-23: qty 1500

## 2015-01-23 MED ORDER — ASPIRIN 81 MG PO CHEW
81.0000 mg | CHEWABLE_TABLET | ORAL | Status: AC
  Administered 2015-01-23: 81 mg via ORAL
  Filled 2015-01-23: qty 1

## 2015-01-23 MED ORDER — SODIUM CHLORIDE 0.9 % IV SOLN
INTRAVENOUS | Status: DC
  Administered 2015-01-23: 250 mL via INTRAVENOUS
  Administered 2015-01-23: 10:00:00 via INTRAVENOUS

## 2015-01-23 MED ORDER — FENTANYL CITRATE (PF) 100 MCG/2ML IJ SOLN
INTRAMUSCULAR | Status: DC | PRN
  Administered 2015-01-23: 50 ug via INTRAVENOUS
  Administered 2015-01-23 (×2): 25 ug via INTRAVENOUS

## 2015-01-23 MED ORDER — MORPHINE SULFATE (PF) 10 MG/ML IV SOLN
2.0000 mg | INTRAVENOUS | Status: DC | PRN
  Administered 2015-01-23: 2 mg via INTRAVENOUS

## 2015-01-23 MED ORDER — SODIUM CHLORIDE 0.9 % IJ SOLN
3.0000 mL | Freq: Two times a day (BID) | INTRAMUSCULAR | Status: DC
  Administered 2015-01-23: 3 mL via INTRAVENOUS

## 2015-01-23 MED ORDER — CHLORTHALIDONE 25 MG PO TABS
12.5000 mg | ORAL_TABLET | Freq: Every day | ORAL | Status: DC
  Administered 2015-01-23 – 2015-01-24 (×2): 12.5 mg via ORAL
  Filled 2015-01-23 (×3): qty 0.5

## 2015-01-23 MED ORDER — METHOCARBAMOL 500 MG PO TABS: 500.0000 mg | ORAL_TABLET | Freq: Three times a day (TID) | ORAL | Status: DC

## 2015-01-23 MED ORDER — FENTANYL CITRATE (PF) 100 MCG/2ML IJ SOLN
INTRAMUSCULAR | Status: AC
  Filled 2015-01-23: qty 2

## 2015-01-23 MED ORDER — LIDOCAINE HCL (PF) 1 % IJ SOLN
INTRAMUSCULAR | Status: AC
  Filled 2015-01-23: qty 30

## 2015-01-23 MED ORDER — CLOPIDOGREL BISULFATE 75 MG PO TABS: 75.0000 mg | ORAL_TABLET | Freq: Every day | ORAL | Status: DC

## 2015-01-23 MED ORDER — ASPIRIN 81 MG PO CHEW
81.0000 mg | CHEWABLE_TABLET | Freq: Every day | ORAL | Status: DC
  Administered 2015-01-24: 81 mg via ORAL
  Filled 2015-01-23: qty 1

## 2015-01-23 MED ORDER — VERAPAMIL HCL 2.5 MG/ML IV SOLN
INTRAVENOUS | Status: AC
  Filled 2015-01-23: qty 2

## 2015-01-23 MED ORDER — ONDANSETRON HCL 4 MG/2ML IJ SOLN: 4.0000 mg | Freq: Four times a day (QID) | INTRAMUSCULAR | Status: DC | PRN

## 2015-01-23 MED ORDER — METOPROLOL TARTRATE 25 MG PO TABS
50.0000 mg | ORAL_TABLET | Freq: Two times a day (BID) | ORAL | Status: DC
  Administered 2015-01-23: 50 mg via ORAL
  Filled 2015-01-23: qty 1
  Filled 2015-01-23 (×2): qty 2

## 2015-01-23 MED ORDER — METHOCARBAMOL 500 MG PO TABS
500.0000 mg | ORAL_TABLET | Freq: Three times a day (TID) | ORAL | Status: DC
  Administered 2015-01-23 – 2015-01-24 (×4): 500 mg via ORAL
  Filled 2015-01-23 (×4): qty 1

## 2015-01-23 MED ORDER — CLOPIDOGREL BISULFATE 300 MG PO TABS
ORAL_TABLET | ORAL | Status: DC | PRN
  Administered 2015-01-23: 300 mg via ORAL

## 2015-01-23 MED ORDER — SODIUM CHLORIDE 0.9 % IV SOLN
INTRAVENOUS | Status: AC
  Administered 2015-01-23 (×2): via INTRAVENOUS

## 2015-01-23 MED ORDER — METOPROLOL SUCCINATE ER 100 MG PO TB24
200.0000 mg | ORAL_TABLET | Freq: Every day | ORAL | Status: DC
  Administered 2015-01-23: 200 mg via ORAL
  Filled 2015-01-23: qty 2

## 2015-01-23 MED ORDER — ASPIRIN 81 MG PO CHEW: 81.0000 mg | CHEWABLE_TABLET | ORAL | Status: DC

## 2015-01-23 MED ORDER — ROSUVASTATIN CALCIUM 20 MG PO TABS: 20.0000 mg | ORAL_TABLET | Freq: Every day | ORAL | Status: DC

## 2015-01-23 MED ORDER — OXYCODONE-ACETAMINOPHEN 7.5-325 MG PO TABS
1.0000 | ORAL_TABLET | Freq: Four times a day (QID) | ORAL | Status: DC | PRN
  Administered 2015-01-23 (×2): 1 via ORAL
  Filled 2015-01-23 (×2): qty 1

## 2015-01-23 MED ORDER — FAMOTIDINE IN NACL 20-0.9 MG/50ML-% IV SOLN
INTRAVENOUS | Status: AC
  Filled 2015-01-23: qty 50

## 2015-01-23 MED ORDER — HEPARIN (PORCINE) IN NACL 2-0.9 UNIT/ML-% IJ SOLN
INTRAMUSCULAR | Status: DC | PRN
  Administered 2015-01-23: 13:00:00 via INTRA_ARTERIAL

## 2015-01-23 SURGICAL SUPPLY — 22 items
BALLN EMERGE MR 2.5X12 (BALLOONS) ×2
BALLN ~~LOC~~ EMERGE MR 3.25X8 (BALLOONS) ×2
BALLN ~~LOC~~ TREK RX 2.75X8 (BALLOONS) ×2
BALLOON EMERGE MR 2.5X12 (BALLOONS) IMPLANT
BALLOON ~~LOC~~ EMERGE MR 3.25X8 (BALLOONS) IMPLANT
BALLOON ~~LOC~~ TREK RX 2.75X8 (BALLOONS) IMPLANT
CATH INFINITI 5 FR JL3.5 (CATHETERS) ×2 IMPLANT
CATH INFINITI 5FR ANG PIGTAIL (CATHETERS) ×2 IMPLANT
CATH INFINITI JR4 5F (CATHETERS) ×2 IMPLANT
CATH VISTA GUIDE 6FR XB3.5 (CATHETERS) ×1 IMPLANT
DEVICE RAD COMP TR BAND LRG (VASCULAR PRODUCTS) ×2 IMPLANT
GLIDESHEATH SLEND SS 6F .021 (SHEATH) ×2 IMPLANT
KIT ENCORE 26 ADVANTAGE (KITS) ×1 IMPLANT
KIT HEART LEFT (KITS) ×2 IMPLANT
PACK CARDIAC CATHETERIZATION (CUSTOM PROCEDURE TRAY) ×2 IMPLANT
STENT SYNERGY DES 2.5X12 (Permanent Stent) ×1 IMPLANT
STENT SYNERGY DES 3X12 (Permanent Stent) ×1 IMPLANT
SYR MEDRAD MARK V 150ML (SYRINGE) ×2 IMPLANT
TRANSDUCER W/STOPCOCK (MISCELLANEOUS) ×2 IMPLANT
TUBING CIL FLEX 10 FLL-RA (TUBING) ×2 IMPLANT
WIRE COUGAR XT STRL 190CM (WIRE) ×1 IMPLANT
WIRE SAFE-T 1.5MM-J .035X260CM (WIRE) ×2 IMPLANT

## 2015-01-23 NOTE — Consult Note (Signed)
CARDIOLOGY CONSULT NOTE   Patient ID: [name] [name] MRN: [id number] DOB/AGE: 1960-05-18 55 y.o.  Admit date: 01/22/2015  Primary Physician   [facility] Valrie Hart., MD Primary Cardiologist   Dr. Antoine Poche Reason for Consultation  Chest apin  HPI: [name] [name] is a 54 y.o. male with a history of CAD s/p multiple cath, HTN, HL, sleep apnea (untreated) who presented for worsening chest pain.    He has a hx of CAD, s/p remote MI with bare-metal stent to the RCA and prior stenting to the CFX in 2000, ischemic cardiomyopathy with a previous EF of 35-40%. LHC 3/07: oDx 50-70%, mOM stent ok with 40% at prox edge, pRCA stent 95% ISR, L-R collats, EF 55%. He had PCI with Cypher DES to the RCA.  Patient was in [place] 04/28/2012 for a business trip and developed sudden onset of CP.He was seen at [facility] for NSTEMI. s/p PCI of an occluded circumflex. This was without 2.5 x 28 mm DES. At that time he had RCA 50-60% in-stent restenosis. The LAD had 40-50% stenosis.   He was in [place] up until a week ago when he started to having exertional anginal pain that resolved with rest. Yesterday evening he was walking when he had substernal chest tightness that was associated with dyspnea. Pain radiated to his left arm. This is similar pain when he had a previous cardiac stent. He hasn't tried any nitroglycerin as he run out of it.  In ED, point-of-care troponin 0.02. Troponin I x negative. EKG nonischemic. Currently chest pain-free.  Past Medical History  Diagnosis Date  . Coronary artery disease     h/o MI; s/p stent to RCA and CFX in 2000;  LHC 3/07: oDx 50-70%, mOM stent ok with 40% at prox edge, pRCA stent 95% ISR, L-R collats, EF 55%.  He had PCI with Cypher DES to the RCA;  s/p NSTEMI at [facility] in [place] 3/14 => PCI of an occluded circumflex. This was without 2.5 x 28 mm DES. At that time he had RCA 50-60% in-stent restenosis. The LAD had 40-50% stenosis.   .  Hyperlipidemia   . Hypertension   . Obesity   . Scoliosis   . CHF (congestive heart failure) (HCC) dx'd 2006  . Myocardial infarction (HCC) < 2006 X 1; 2014    "both mild; minimal, if any damage"  . Sleep apnea     "years ago; did wear mask" (01/22/2015)  . History of blood transfusion 1978    "when I had Harrington rod placement"  . GERD (gastroesophageal reflux disease)   . DDD (degenerative disc disease)   . Arthritis     "shoulders" (01/22/2015)  . Sciatic leg pain     "left side"  . Chronic lower back pain      Past Surgical History  Procedure Laterality Date  . Tonsillectomy    . Spine surgery  1978    "Harrington rod placement"  . Back surgery    . Lumbar disc surgery      1990's?  . Cardiac catheterization  2006  . Coronary angioplasty with stent placement  ~ 2001 - 04/2012 X 4    "I've had a total of 4 placed; one is inside another" (01/22/2015)    No Known Allergies  I have reviewed the patient's current medications . [START ON 01/24/2015] aspirin EC  325 mg Oral Daily  . chlorthalidone  12.5 mg Oral Daily  . clopidogrel  75 mg Oral  QHS  . enalapril  20 mg Oral BID  . heparin  5,000 Units Subcutaneous 3 times per day  . methocarbamol  500 mg Oral 3 times per day  . metoprolol tartrate  50 mg Oral BID  . pantoprazole  40 mg Oral Daily  . rosuvastatin  20 mg Oral q1800  . sodium chloride  3 mL Intravenous Q12H  . sodium chloride  3 mL Intravenous Q12H     sodium chloride, acetaminophen, ondansetron (ZOFRAN) IV, oxyCODONE-acetaminophen, sodium chloride  Prior to Admission medications   Medication Sig Start Date End Date Taking? Authorizing Provider  chlorthalidone (HYGROTON) 25 MG tablet Take 0.5 tablets (12.5 mg total) by mouth daily. 07/02/14  Yes Rollene Rotunda, MD  clopidogrel (PLAVIX) 75 MG tablet Take 1 tablet (75 mg total) by mouth daily. 04/05/13  Yes Rollene Rotunda, MD  enalapril (VASOTEC) 20 MG tablet Take 1 tablet (20 mg total) by mouth 2 (two) times  daily. 10/13/14  Yes Rollene Rotunda, MD  Ibuprofen-Diphenhydramine Cit (ADVIL PM PO) Take 2 tablets by mouth daily as needed (sleep).   Yes Historical Provider, MD  lansoprazole (PREVACID) 30 MG capsule Take 30 mg by mouth daily.     Yes Historical Provider, MD  methocarbamol (ROBAXIN) 500 MG tablet Take 500 mg by mouth 3 (three) times daily.   Yes Historical Provider, MD  metoprolol (TOPROL-XL) 200 MG 24 hr tablet Take 1 tablet (200 mg total) by mouth daily. 10/13/14  Yes Rollene Rotunda, MD  nitroGLYCERIN (NITROSTAT) 0.3 MG SL tablet Place 0.3 mg under the tongue every 5 (five) minutes as needed for chest pain.   Yes Historical Provider, MD  oxyCODONE-acetaminophen (PERCOCET) 7.5-325 MG tablet Take 1 tablet by mouth every 6 (six) hours as needed for moderate pain.  03/21/15  Yes Historical Provider, MD  rosuvastatin (CRESTOR) 20 MG tablet Take 1 tablet (20 mg total) by mouth daily. 03/18/14  Yes Rollene Rotunda, MD     Social History   Social History  . Marital Status: Married    Spouse Name: N/A  . Number of Children: N/A  . Years of Education: N/A   Occupational History  . Not on file.   Social History Main Topics  . Smoking status: Never Smoker   . Smokeless tobacco: Never Used  . Alcohol Use: Yes     Comment: 01/22/2015 "might have a few beers/year"  . Drug Use: No  . Sexual Activity: Yes   Other Topics Concern  . Not on file   Social History Narrative    Family Status  Relation Status Death Age  . Father Deceased   . Mother Alive    Family History  Problem Relation Age of Onset  . Heart disease Father   . Sleep apnea Father   . Atrial fibrillation Father   . Alcohol abuse Father       ROS:  Full 14 point review of systems complete and found to be negative unless listed above.  Physical Exam: Blood pressure 104/73, pulse 71, temperature 98.1 F (36.7 C), temperature source Oral, resp. rate 18, height  (1.626 m), weight 171 lb 3.2 oz (77.656 kg), SpO2 96 %.    General: Well developed, well nourished, male in no acute distress Head: Eyes PERRLA, No xanthomas. Normocephalic and atraumatic, oropharynx without edema or exudate.  Lungs: Resp regular and unlabored, CTA. Heart: RRR no s3, s4, or murmurs..   Neck: No carotid bruits. No lymphadenopathy.  JVD. Abdomen: Bowel sounds present, abdomen soft and  non-tender without masses or hernias noted. Msk:  No spine or cva tenderness. No weakness, no joint deformities or effusions. Extremities: No clubbing, cyanosis or edema. DP/PT/Radials 2+ and equal bilaterally. Neuro: Alert and oriented X 3. No focal deficits noted. Psych:  Good affect, responds appropriately Skin: No rashes or lesions noted.  Labs:   Lab Results  Component Value Date   WBC 8.3 01/23/2015   HGB 13.5 01/23/2015   HCT 37.8* 01/23/2015   MCV 84.6 01/23/2015   PLT 210 01/23/2015    Recent Labs  01/23/15 0322  INR 1.10    Recent Labs Lab 01/22/15 1930 01/23/15 0322  NA 135  --   K 3.5  --   CL 96*  --   CO2 31  --   BUN 13  --   CREATININE 1.23 0.95  CALCIUM 9.1  --   GLUCOSE 144*  --    MAGNESIUM  Date Value Ref Range Status  01/23/2015 1.8 1.7 - 2.4 mg/dL Final    Recent Labs  11/91/4710/10/31 0322  TROPONINI <0.03    Recent Labs  01/22/15 1932  TROPIPOC 0.02   No results found for: PROBNP Lab Results  Component Value Date   CHOL 183 12/28/2007   HDL 23.7* 12/28/2007   LDLCALC 128* 12/28/2007   TRIG 155* 12/28/2007    TSH  Date/Time Value Ref Range Status  01/23/2015 03:22 AM 1.537 0.350 - 4.500 uIU/mL Final   No results found for: VITAMINB12, FOLATE, FERRITIN, TIBC, IRON, RETICCTPCT  Echo: 04/2014 Normal left ventricular function. Mild mitral regurgitation. Mild pulmonary hypertension.  ECG:   Vent. rate 91 BPM PR interval 140 ms QRS duration 94 ms QT/QTc 358/440 ms P-R-T axes 60 -32 22  Radiology:  Dg Chest 2 View  01/22/2015  CLINICAL DATA:  Chest tightness and pain for several weeks,  worse today. EXAM: CHEST  2 VIEW COMPARISON:  04/22/2005. FINDINGS: Trachea is midline. Heart size normal. Lungs are clear. No pleural fluid. A single Harrington rod traverses the thoracolumbar spine. Flowing anterior osteophytosis in the thoracic spine. IMPRESSION: No acute findings. Electronically Signed   By: Leanna BattlesMelinda  Blietz M.D.   On: 01/22/2015 20:12    ASSESSMENT AND PLAN:     1. Exertional anginal pain - Ongoing for the past week. This is similar pain when he had a stent placement in the past. episode yesterday evening while walking that was associated with dyspnea and radiation of pain to his left arm. - EKG non ischemic. Trop x 1 negative.  - Continue aspirin, Plavix, beta blocker, statin. - Proceed with cath today.   The patient understands that risks include but are not limited to stroke (1 in 1000), death (1 in 1000), kidney failure [usually temporary] (1 in 500), bleeding (1 in 200), allergic reaction [possibly serious] (1 in 200), and agrees to proceed.   2. CAD - Multiple stent in the past.  - Echo 07/2012 showed - Normal left ventricular function. Mild mitral regurgitation. Mild pulmonary hypertension.  3 HTN - Stable and well controlled. Continue current regimen.   4. Treated sleep apnea - Per Dr. Jenene SlickerHochrein's last note 03/18/2014 plan was to set up sleep study. However, never followed up. Will set up as an outpatient.  5. HL -No results found for requested labs within last 365 days.  - will get lipid panel.  - continue stain at current dose.    SignedManson Passey: Tambi Thole, PA 01/23/2015, 9:25 AM Pager [phone]  Co-Sign MD

## 2015-01-23 NOTE — Interval H&P Note (Signed)
History and Physical Interval Note:  01/23/2015 1:17 PM  [name] [name]  has presented today for cardiac cath with the diagnosis of unstable angina, known CAD. The various methods of treatment have been discussed with the patient and family. After consideration of risks, benefits and other options for treatment, the patient has consented to  Procedure(s): Left Heart Cath and Coronary Angiography (N/A) as a surgical intervention .  The patient's history has been reviewed, patient examined, no change in status, stable for surgery.  I have reviewed the patient's chart and labs.  Questions were answered to the patient's satisfaction.    Cath Lab Visit (complete for each Cath Lab visit)  Clinical Evaluation Leading to the Procedure:   ACS: No.  Non-ACS:    Anginal Classification: CCS III  Anti-ischemic medical therapy: Minimal Therapy (1 class of medications)  Non-Invasive Test Results: No non-invasive testing performed  Prior CABG: No previous CABG        Johnay Mano

## 2015-01-23 NOTE — H&P (Signed)
[facility] History and Physical  Linell Shawn QMV:[id number] DOB: 21-Feb-1960 DOA: 01/22/2015  Referring physician: ED physician PCP: Noni Saupe., MD  Specialists: Dr. Antoine Poche of [facility]  Chief Complaint:   HPI: [name] [name] is a 54 y.o. male with PMH of coronary artery disease status post multiple drug-eluting stents, in stent occlusions, and restenting, most recently in 2014, who presents to the ED with 1 week of worsening intermittent chest pain on exertion. Patient reports that he's been fairly stable without significant angina since 2014 but approximately one week ago he began to experience mild and occasional chest tightness and shortness of breath with exertion. At approximately 6 PM on 01/22/2015, patient reports severe substernal chest pressure and dyspnea upon walking. He reported that these are the same symptoms that he's had with his myocardial infarction in 2000, and when he required additional stents in 2007 and 2014. The pain was alleviated with rest. He reports compliance with his statin and Plavix and has no other complaints.  In ED, patient was found to be afebrile with vital signs stable and pain-free. He was given a full dose aspirin and 12-lead EKG demonstrated a normal sinus rhythm with left axis deviation but no acute ischemic changes. Initial troponin returned at 0.02 and the patient remained stable in the emergency department. He was admitted under observation to the telemetry unit for ongoing evaluation and management.  Where does patient live?   At home    Can patient participate in ADLs?  Yes      Review of Systems:   General: no fevers, chills, sweats, weight change, poor appetite, or fatigue HEENT: no blurry vision, hearing changes or sore throat Pulm: exertional dyspnea, no cough, or wheeze CV: chest pain no palpitations Abd: no nausea, vomiting, abdominal pain, diarrhea, or constipation GU: no dysuria, hematuria, increased urinary  frequency, or urgency  Ext: no leg edema Neuro: no focal weakness, numbness, or tingling, no vision change or hearing loss Skin: no rash, no wounds MSK: No muscle spasm, no deformity, no red, hot, or swollen joint Heme: No easy bruising or bleeding Travel history: No recent long distant travel    Allergy: No Known Allergies  Past Medical History  Diagnosis Date  . Coronary artery disease     h/o MI; s/p stent to RCA and CFX in 2000;  LHC 3/07: oDx 50-70%, mOM stent ok with 40% at prox edge, pRCA stent 95% ISR, L-R collats, EF 55%.  He had PCI with Cypher DES to the RCA;  s/p NSTEMI at [facility] in [place] 3/14 => PCI of an occluded circumflex. This was without 2.5 x 28 mm DES. At that time he had RCA 50-60% in-stent restenosis. The LAD had 40-50% stenosis.   . Hyperlipidemia   . Hypertension   . Obesity   . Scoliosis   . CHF (congestive heart failure) (HCC) dx'd 2006  . Myocardial infarction (HCC) < 2006 X 1; 2014    "both mild; minimal, if any damage"  . Sleep apnea     "years ago; did wear mask" (01/22/2015)  . History of blood transfusion 1978    "when I had Harrington rod placement"  . GERD (gastroesophageal reflux disease)   . DDD (degenerative disc disease)   . Arthritis     "shoulders" (01/22/2015)  . Sciatic leg pain     "left side"  . Chronic lower back pain     Past Surgical History  Procedure Laterality Date  . Tonsillectomy    .  Spine surgery  1978    "Harrington rod placement"  . Back surgery    . Lumbar disc surgery      1990's?  . Cardiac catheterization  2006  . Coronary angioplasty with stent placement  ~ 2001 - 04/2012 X 4    "I've had a total of 4 placed; one is inside another" (01/22/2015)    Social History:  reports that he has never smoked. He has never used smokeless tobacco. He reports that he drinks alcohol. He reports that he does not use illicit drugs.  Family History:  Family History  Problem Relation Age of Onset  . Heart  disease Father   . Sleep apnea Father   . Atrial fibrillation Father   . Alcohol abuse Father      Prior to Admission medications   Medication Sig Start Date End Date Taking? Authorizing Provider  chlorthalidone (HYGROTON) 25 MG tablet Take 0.5 tablets (12.5 mg total) by mouth daily. 07/02/14  Yes Rollene Rotunda, MD  clopidogrel (PLAVIX) 75 MG tablet Take 1 tablet (75 mg total) by mouth daily. 04/05/13  Yes Rollene Rotunda, MD  enalapril (VASOTEC) 20 MG tablet Take 1 tablet (20 mg total) by mouth 2 (two) times daily. 10/13/14  Yes Rollene Rotunda, MD  Ibuprofen-Diphenhydramine Cit (ADVIL PM PO) Take 2 tablets by mouth daily as needed (sleep).   Yes Historical Provider, MD  lansoprazole (PREVACID) 30 MG capsule Take 30 mg by mouth daily.     Yes Historical Provider, MD  methocarbamol (ROBAXIN) 500 MG tablet Take 500 mg by mouth 3 (three) times daily.   Yes Historical Provider, MD  metoprolol (TOPROL-XL) 200 MG 24 hr tablet Take 1 tablet (200 mg total) by mouth daily. 10/13/14  Yes Rollene Rotunda, MD  nitroGLYCERIN (NITROSTAT) 0.3 MG SL tablet Place 0.3 mg under the tongue every 5 (five) minutes as needed for chest pain.   Yes Historical Provider, MD  oxyCODONE-acetaminophen (PERCOCET) 7.5-325 MG tablet Take 1 tablet by mouth every 6 (six) hours as needed for moderate pain.  03/21/15  Yes Historical Provider, MD  rosuvastatin (CRESTOR) 20 MG tablet Take 1 tablet (20 mg total) by mouth daily. 03/18/14  Yes Rollene Rotunda, MD    Physical Exam: Filed Vitals:   01/22/15 2100 01/22/15 2145 01/22/15 2241 01/23/15 0204  BP: 116/69 111/72 119/81 130/95  Pulse: 84 80 89   Temp:   97.8 F (36.6 C)   TempSrc:   Oral   Resp: Height:      Weight:   77.656 kg (171 lb 3.2 oz)   SpO2: 98% 98% 98%    General: Not in acute distress HEENT:       Eyes: PERRL, EOMI, no scleral icterus or conjunctival pallor.       ENT: No discharge from the ears or nose, no pharyngeal ulcers, petechiae or exudate, no  tonsillar enlargement.        Neck: No JVD, no bruit, no appreciable mass Heme: No cervical adenopathy, no pallor Cardiac: S1/S2, RRR, No murmurs, No gallops or rubs. Pulm: Good air movement bilaterally. No rales, wheezing, rhonchi or rubs. Abd: Soft, nondistended, nontender, no rebound pain or gaurding, no mass or organomegaly, BS present. Ext: No LE edema bilaterally. 2+DP/PT pulse bilaterally. Musculoskeletal: No gross deformity, no red, hot, swollen joints, no limitation in ROM  Skin: No rashes or wounds on exposed surfaces  Neuro: Alert, oriented X3. No focal findings Psych: Patient is not overtly psychotic.  Labs  on Admission:  Basic Metabolic Panel:  Recent Labs Lab 01/22/15 1930  NA 135  K 3.5  CL 96*  CO2 31  GLUCOSE 144*  BUN 13  CREATININE 1.23  CALCIUM 9.1   Liver Function Tests: No results for input(s): AST, ALT, ALKPHOS, BILITOT, PROT, ALBUMIN in the last 168 hours. No results for input(s): LIPASE, AMYLASE in the last 168 hours. No results for input(s): AMMONIA in the last 168 hours. CBC:  Recent Labs Lab 01/22/15 1930  WBC 9.3  HGB 14.6  HCT 41.1  MCV 85.1  PLT 255   Cardiac Enzymes: No results for input(s): CKTOTAL, CKMB, CKMBINDEX, TROPONINI in the last 168 hours.  BNP (last 3 results) No results for input(s): BNP in the last 8760 hours.  ProBNP (last 3 results) No results for input(s): PROBNP in the last 8760 hours.  CBG: No results for input(s): GLUCAP in the last 168 hours.  Radiological Exams on Admission: Dg Chest 2 View  01/22/2015  CLINICAL DATA:  Chest tightness and pain for several weeks, worse today. EXAM: CHEST  2 VIEW COMPARISON:  04/22/2005. FINDINGS: Trachea is midline. Heart size normal. Lungs are clear. No pleural fluid. A single Harrington rod traverses the thoracolumbar spine. Flowing anterior osteophytosis in the thoracic spine. IMPRESSION: No acute findings. Electronically Signed   By: Leanna BattlesMelinda  Blietz M.D.   On: 01/22/2015  20:12    EKG: Independently reviewed.  Abnormal findings:    LAD, no acute findings  Assessment/Plan 1. Chest pain - Patient's symptoms are very concerning for myocardial ischemia  - Serial cardiac biomarkers will be obtained and EKG will be repeated stat for any further chest pain episode - Full dose aspirin was given in the emergency department, beta blocker was administered on the floor, as well as a statin - Nitroglycerin will be available when necessary chest pain - Supplemental oxygen will be titrated to maintain O2 saturations greater than 92% - Patient will be monitored on continuous telemetry - Stress test has not been ordered at this time given a high suspicion for ischemic disease and possible indication for coronary angiography in the morning - He'll be kept nothing by mouth  2. Hyperlipidemia - Statin will be continued and lipid panel is pending  3. Hypertension - Patient will be continued on his home antihypertensives with monitoring  4. GERD - Patient will be continued on proton pump inhibition   DVT ppx: SQ Heparin     Code Status: Full code Family Communication: None at bed side.      Disposition Plan: Admit to observation   Date of Service 01/23/2015    Lavone Neriimothy S Adasyn Mcadams [facility] Pager [phone]  If 7PM-7AM, please contact night-coverage [email or web address] Password [email or web address] 01/23/2015, 2:20 AM

## 2015-01-23 NOTE — Progress Notes (Signed)
TR BAND REMOVAL  LOCATION:  right radial  DEFLATED PER PROTOCOL:  Yes.    TIME BAND OFF / DRESSING APPLIED:   1900   SITE UPON ARRIVAL:   Level 0  SITE AFTER BAND REMOVAL:  Level 0  CIRCULATION SENSATION AND MOVEMENT:  Within Normal Limits  Yes.    COMMENTS:    

## 2015-01-23 NOTE — H&P (View-Only) (Signed)
 CARDIOLOGY CONSULT NOTE   Patient ID: [name] [name] MRN: [id number] DOB/AGE: 03/28/1960 54 y.o.  Admit date: 01/22/2015  Primary Physician   REDDING II,JOHN F., MD Primary Cardiologist   Dr. Hochrein Reason for Consultation  Chest apin  HPI: [name] [name] is a 54 y.o. male with a history of CAD s/p multiple cath, HTN, HL, sleep apnea (untreated) who presented for worsening chest pain.    He has a hx of CAD, s/p remote MI with bare-metal stent to the RCA and prior stenting to the CFX in 2000, ischemic cardiomyopathy with a previous EF of 35-40%. LHC 3/07: oDx 50-70%, mOM stent ok with 40% at prox edge, pRCA stent 95% ISR, L-R collats, EF 55%. He had PCI with Cypher DES to the RCA.  Patient was in [place] 04/28/2012 for a business trip and developed sudden onset of CP.He was seen at [facility] for NSTEMI. s/p PCI of an occluded circumflex. This was without 2.5 x 28 mm DES. At that time he had RCA 50-60% in-stent restenosis. The LAD had 40-50% stenosis.   He was in [facility] up until a week ago when he started to having exertional anginal pain that resolved with rest. Yesterday evening he was walking when he had substernal chest tightness that was associated with dyspnea. Pain radiated to his left arm. This is similar pain when he had a previous cardiac stent. He hasn't tried any nitroglycerin as he run out of it.  In ED, point-of-care troponin 0.02. Troponin I x negative. EKG nonischemic. Currently chest pain-free.  Past Medical History  Diagnosis Date  . Coronary artery disease     h/o MI; s/p stent to RCA and CFX in 2000;  LHC 3/07: oDx 50-70%, mOM stent ok with 40% at prox edge, pRCA stent 95% ISR, L-R collats, EF 55%.  He had PCI with Cypher DES to the RCA;  s/p NSTEMI at [facility] in [place] 3/14 => PCI of an occluded circumflex. This was without 2.5 x 28 mm DES. At that time he had RCA 50-60% in-stent restenosis. The LAD had 40-50% stenosis.   .  Hyperlipidemia   . Hypertension   . Obesity   . Scoliosis   . CHF (congestive heart failure) (HCC) dx'd 2006  . Myocardial infarction (HCC) < 2006 X 1; 2014    "both mild; minimal, if any damage"  . Sleep apnea     "years ago; did wear mask" (01/22/2015)  . History of blood transfusion 1978    "when I had Harrington rod placement"  . GERD (gastroesophageal reflux disease)   . DDD (degenerative disc disease)   . Arthritis     "shoulders" (01/22/2015)  . Sciatic leg pain     "left side"  . Chronic lower back pain      Past Surgical History  Procedure Laterality Date  . Tonsillectomy    . Spine surgery  1978    "Harrington rod placement"  . Back surgery    . Lumbar disc surgery      1990's?  . Cardiac catheterization  2006  . Coronary angioplasty with stent placement  ~ 2001 - 04/2012 X 4    "I've had a total of 4 placed; one is inside another" (01/22/2015)    No Known Allergies  I have reviewed the patient's current medications . [START ON 01/24/2015] aspirin EC  325 mg Oral Daily  . chlorthalidone  12.5 mg Oral Daily  . clopidogrel  75 mg Oral   QHS  . enalapril  20 mg Oral BID  . heparin  5,000 Units Subcutaneous 3 times per day  . methocarbamol  500 mg Oral 3 times per day  . metoprolol tartrate  50 mg Oral BID  . pantoprazole  40 mg Oral Daily  . rosuvastatin  20 mg Oral q1800  . sodium chloride  3 mL Intravenous Q12H  . sodium chloride  3 mL Intravenous Q12H     sodium chloride, acetaminophen, ondansetron (ZOFRAN) IV, oxyCODONE-acetaminophen, sodium chloride  Prior to Admission medications   Medication Sig Start Date End Date Taking? Authorizing Provider  chlorthalidone (HYGROTON) 25 MG tablet Take 0.5 tablets (12.5 mg total) by mouth daily. 07/02/14  Yes Rollene Rotunda, MD  clopidogrel (PLAVIX) 75 MG tablet Take 1 tablet (75 mg total) by mouth daily. 04/05/13  Yes Rollene Rotunda, MD  enalapril (VASOTEC) 20 MG tablet Take 1 tablet (20 mg total) by mouth 2 (two) times  daily. 10/13/14  Yes Rollene Rotunda, MD  Ibuprofen-Diphenhydramine Cit (ADVIL PM PO) Take 2 tablets by mouth daily as needed (sleep).   Yes Historical Provider, MD  lansoprazole (PREVACID) 30 MG capsule Take 30 mg by mouth daily.     Yes Historical Provider, MD  methocarbamol (ROBAXIN) 500 MG tablet Take 500 mg by mouth 3 (three) times daily.   Yes Historical Provider, MD  metoprolol (TOPROL-XL) 200 MG 24 hr tablet Take 1 tablet (200 mg total) by mouth daily. 10/13/14  Yes Rollene Rotunda, MD  nitroGLYCERIN (NITROSTAT) 0.3 MG SL tablet Place 0.3 mg under the tongue every 5 (five) minutes as needed for chest pain.   Yes Historical Provider, MD  oxyCODONE-acetaminophen (PERCOCET) 7.5-325 MG tablet Take 1 tablet by mouth every 6 (six) hours as needed for moderate pain.  03/21/15  Yes Historical Provider, MD  rosuvastatin (CRESTOR) 20 MG tablet Take 1 tablet (20 mg total) by mouth daily. 03/18/14  Yes Rollene Rotunda, MD     Social History   Social History  . Marital Status: Married    Spouse Name: N/A  . Number of Children: N/A  . Years of Education: N/A   Occupational History  . Not on file.   Social History Main Topics  . Smoking status: Never Smoker   . Smokeless tobacco: Never Used  . Alcohol Use: Yes     Comment: 01/22/2015 "might have a few beers/year"  . Drug Use: No  . Sexual Activity: Yes   Other Topics Concern  . Not on file   Social History Narrative    Family Status  Relation Status Death Age  . Father Deceased   . Mother Alive    Family History  Problem Relation Age of Onset  . Heart disease Father   . Sleep apnea Father   . Atrial fibrillation Father   . Alcohol abuse Father       ROS:  Full 14 point review of systems complete and found to be negative unless listed above.  Physical Exam: Blood pressure 104/73, pulse 71, temperature 98.1 F (36.7 C), temperature source Oral, resp. rate 18, height  (1.626 m), weight 171 lb 3.2 oz (77.656 kg), SpO2 96 %.    General: Well developed, well nourished, male in no acute distress Head: Eyes PERRLA, No xanthomas. Normocephalic and atraumatic, oropharynx without edema or exudate.  Lungs: Resp regular and unlabored, CTA. Heart: RRR no s3, s4, or murmurs..   Neck: No carotid bruits. No lymphadenopathy.  JVD. Abdomen: Bowel sounds present, abdomen soft and  non-tender without masses or hernias noted. Msk:  No spine or cva tenderness. No weakness, no joint deformities or effusions. Extremities: No clubbing, cyanosis or edema. DP/PT/Radials 2+ and equal bilaterally. Neuro: Alert and oriented X 3. No focal deficits noted. Psych:  Good affect, responds appropriately Skin: No rashes or lesions noted.  Labs:   Lab Results  Component Value Date   WBC 8.3 01/23/2015   HGB 13.5 01/23/2015   HCT 37.8* 01/23/2015   MCV 84.6 01/23/2015   PLT 210 01/23/2015    Recent Labs  01/23/15 0322  INR 1.10    Recent Labs Lab 01/22/15 1930 01/23/15 0322  NA 135  --   K 3.5  --   CL 96*  --   CO2 31  --   BUN 13  --   CREATININE 1.23 0.95  CALCIUM 9.1  --   GLUCOSE 144*  --    MAGNESIUM  Date Value Ref Range Status  01/23/2015 1.8 1.7 - 2.4 mg/dL Final    Recent Labs  11/91/4710/10/31 0322  TROPONINI <0.03    Recent Labs  01/22/15 1932  TROPIPOC 0.02   No results found for: PROBNP Lab Results  Component Value Date   CHOL 183 12/28/2007   HDL 23.7* 12/28/2007   LDLCALC 128* 12/28/2007   TRIG 155* 12/28/2007    TSH  Date/Time Value Ref Range Status  01/23/2015 03:22 AM 1.537 0.350 - 4.500 uIU/mL Final   No results found for: VITAMINB12, FOLATE, FERRITIN, TIBC, IRON, RETICCTPCT  Echo: 04/2014 Normal left ventricular function. Mild mitral regurgitation. Mild pulmonary hypertension.  ECG:   Vent. rate 91 BPM PR interval 140 ms QRS duration 94 ms QT/QTc 358/440 ms P-R-T axes 60 -32 22  Radiology:  Dg Chest 2 View  01/22/2015  CLINICAL DATA:  Chest tightness and pain for several weeks,  worse today. EXAM: CHEST  2 VIEW COMPARISON:  04/22/2005. FINDINGS: Trachea is midline. Heart size normal. Lungs are clear. No pleural fluid. A single Harrington rod traverses the thoracolumbar spine. Flowing anterior osteophytosis in the thoracic spine. IMPRESSION: No acute findings. Electronically Signed   By: Leanna BattlesMelinda  Blietz M.D.   On: 01/22/2015 20:12    ASSESSMENT AND PLAN:     1. Exertional anginal pain - Ongoing for the past week. This is similar pain when he had a stent placement in the past. episode yesterday evening while walking that was associated with dyspnea and radiation of pain to his left arm. - EKG non ischemic. Trop x 1 negative.  - Continue aspirin, Plavix, beta blocker, statin. - Proceed with cath today.   The patient understands that risks include but are not limited to stroke (1 in 1000), death (1 in 1000), kidney failure [usually temporary] (1 in 500), bleeding (1 in 200), allergic reaction [possibly serious] (1 in 200), and agrees to proceed.   2. CAD - Multiple stent in the past.  - Echo 07/2012 showed - Normal left ventricular function. Mild mitral regurgitation. Mild pulmonary hypertension.  3 HTN - Stable and well controlled. Continue current regimen.   4. Treated sleep apnea - Per Dr. Jenene SlickerHochrein's last note 03/18/2014 plan was to set up sleep study. However, never followed up. Will set up as an outpatient.  5. HL -No results found for requested labs within last 365 days.  - will get lipid panel.  - continue stain at current dose.    SignedManson Passey: Eulogio Requena, PA 01/23/2015, 9:25 AM Pager [phone]  Co-Sign MD

## 2015-01-23 NOTE — Progress Notes (Signed)
Dr Caprice RedHockrein states to hold patient's Plavix and SQ Heparin tonight because patient has had medications earlier for cath procedure. Will not give.

## 2015-01-23 NOTE — Progress Notes (Signed)
Patient admitted after midnight. Chart reviewed. Patient examined. No chest pain currently. Currently nothing by mouth. Will likely need cardiac catheterization. Have consulted cardiology. Primary cardiologist is Dr. Antoine PocheHochrein.  Crista Curborinna Percy Winterrowd, MD [facility] [email or web address]

## 2015-01-24 ENCOUNTER — Other Ambulatory Visit: Payer: Self-pay

## 2015-01-24 DIAGNOSIS — I200 Unstable angina: Secondary | ICD-10-CM

## 2015-01-24 DIAGNOSIS — I209 Angina pectoris, unspecified: Secondary | ICD-10-CM

## 2015-01-24 DIAGNOSIS — I10 Essential (primary) hypertension: Secondary | ICD-10-CM

## 2015-01-24 LAB — CBC
HEMATOCRIT: 39 % (ref 39.0–52.0)
Hemoglobin: 13.5 g/dL (ref 13.0–17.0)
MCH: 30 pg (ref 26.0–34.0)
MCHC: 34.6 g/dL (ref 30.0–36.0)
MCV: 86.7 fL (ref 78.0–100.0)
Platelets: 209 10*3/uL (ref 150–400)
RBC: 4.5 MIL/uL (ref 4.22–5.81)
RDW: 13 % (ref 11.5–15.5)
WBC: 7.2 10*3/uL (ref 4.0–10.5)

## 2015-01-24 LAB — HEMOGLOBIN A1C
Hgb A1c MFr Bld: 6.4 % — ABNORMAL HIGH (ref 4.8–5.6)
Mean Plasma Glucose: 137 mg/dL

## 2015-01-24 LAB — BASIC METABOLIC PANEL
Anion gap: 7 (ref 5–15)
BUN: 12 mg/dL (ref 6–20)
CALCIUM: 8.8 mg/dL — AB (ref 8.9–10.3)
CO2: 33 mmol/L — ABNORMAL HIGH (ref 22–32)
Chloride: 98 mmol/L — ABNORMAL LOW (ref 101–111)
Creatinine, Ser: 0.93 mg/dL (ref 0.61–1.24)
GFR calc Af Amer: >60 mL/min (ref >60)
GLUCOSE: 135 mg/dL — AB (ref 65–99)
POTASSIUM: 3.7 mmol/L (ref 3.5–5.1)
Sodium: 138 mmol/L (ref 135–145)

## 2015-01-24 MED ORDER — OMEGA-3-ACID ETHYL ESTERS 1 G PO CAPS: 2.0000 g | ORAL_CAPSULE | Freq: Two times a day (BID) | ORAL | Status: DC

## 2015-01-24 MED ORDER — OMEGA-3-ACID ETHYL ESTERS 1 G PO CAPS
2.0000 g | ORAL_CAPSULE | Freq: Two times a day (BID) | ORAL | Status: DC
  Administered 2015-01-24: 2 g via ORAL
  Filled 2015-01-24: qty 2

## 2015-01-24 MED ORDER — ROSUVASTATIN CALCIUM 20 MG PO TABS: 40.0000 mg | ORAL_TABLET | Freq: Every day | ORAL | Status: DC

## 2015-01-24 MED ORDER — ASPIRIN 81 MG PO CHEW: 81.0000 mg | CHEWABLE_TABLET | Freq: Every day | ORAL | Status: AC

## 2015-01-24 MED ORDER — ROSUVASTATIN CALCIUM 40 MG PO TABS: 40.0000 mg | ORAL_TABLET | Freq: Every day | ORAL | Status: DC

## 2015-01-24 NOTE — Progress Notes (Signed)
Patient has already walked two times this morning without angina or difficulty.  He has had multiple stents in the past with the last being 2014.  He is aware of his risk factors, exercise, and nutritional guidelines, he just needs to do them.  His diet could use some work, but he does exercise routinely.  I strongly recommended phase II cardiac rehab for the education component, I referred him to the [place].  He is not interested, but will consider it. [phone]

## 2015-01-24 NOTE — Discharge Summary (Signed)
Physician Discharge Summary  [name] BoucheBobby [name] WUJ:[id number] DOB: 1961/02/11 DOA: 01/22/2015  PCP: Noni SaupeEDDING II,JOHN F., MD  Admit date: 01/22/2015 Discharge date: 01/24/2015  Time spent: greater than 30 minutes  Discharge Diagnoses:  Primary problem:   Unstable angina [facility](HCC) Active Problems:   Hyperlipidemia   Essential hypertension   Coronary atherosclerosis   Chest pain   Hypertensive heart disease with heart failure [facility](HCC)  Discharge Condition: stable  Diet recommendation: heart healthy  Filed Weights   01/22/15 1919 01/22/15 2241 01/24/15 0430  Weight: 72.576 kg (160 lb) 77.656 kg (171 lb 3.2 oz) 79.6 kg (175 lb 7.8 oz)    History of present illness:  54 y.o. male with PMH of coronary artery disease status post multiple drug-eluting stents, in stent occlusions, and restenting, most recently in 2014, who presents to the ED with 1 week of worsening intermittent chest pain on exertion. Patient reports that he's been fairly stable without significant angina since 2014 but approximately one week ago he began to experience mild and occasional chest tightness and shortness of breath with exertion. At approximately 6 PM on 01/22/2015, patient reports severe substernal chest pressure and dyspnea upon walking. He reported that these are the same symptoms that he's had with his myocardial infarction in 2000, and when he required additional stents in 2007 and 2014. The pain was alleviated with rest. He reports compliance with his statin and Plavix and has no other complaints.  In ED, patient was found to be afebrile with vital signs stable and pain-free. He was given a full dose aspirin and 12-lead EKG demonstrated a normal sinus rhythm with left axis deviation but no acute ischemic changes. Initial troponin returned at 0.02   Hospital Course:  Admitted to telemetry. MI ruled out. Cardiology and took patient to cath lab. See below report:   1. Triple vessel CAD.  2. The LAD has moderate diffuse  disease proximally, none of which appears to be flow limiting. There is a moderate caliber diagonal branch with ostial 80% stenosis. Based on prior caths, this has not changed and is not felt to be responsible for his change in clinical symptoms.  3. The Circumflex terminates into an OM branch. The proximal vessel has a 95% stenosis just before the stented segment. There is diffuse 30% restenosis in the stented segment (2 layers of stents). Just beyond the stent there is a focal 95% stenosis in the mid vessel.  4. The RCA is a large dominant vessel with a mid stented segment (2 layers of stents, most recently Cypher DES in 2007). The stented segment has diffuse 60-70% restenosis. None of these lesions are severe.  5. Mild LV systolic dysfunction 6. Successful PTCA/DES x 1 proximal Circumflex 7. Successful PTCA/DES x 1 mid Circumflex.   Recommendations: Continue ASA and Plavix for lifetime. If he has recurrent chest pain, consider FFR of RCA although treatment of the stented segment would likely neccesitate a third layer of stent. The Diagonal branch has been diseased for years and described as such in cath reports dating back to 2005.      Has had no further chest pain. crestor increased to 40 mg and lovaza added per cardiology recommendations  Procedures:  Cardiac catheterization and DES as above  Consultations:  cardiology  Discharge Exam: Filed Vitals:   01/24/15 0430 01/24/15 0805  BP: 89/36 147/89  Pulse: 83 90  Temp: 97.6 F (36.4 C) 97.7 F (36.5 C)  Resp: 11 16    General: comfortable, walking around in  room Cardiovascular: RRR Respiratory: CTA  Discharge Instructions   Discharge Instructions    Diet - low sodium heart healthy    Complete by:  As directed      Increase activity slowly    Complete by:  As directed           Current Discharge Medication List    START taking these medications   Details  aspirin 81 MG chewable tablet Chew 1 tablet (81 mg  total) by mouth daily.    omega-3 acid ethyl esters (LOVAZA) 1 G capsule Take 2 capsules (2 g total) by mouth 2 (two) times daily. Qty: 30 capsule, Refills: 1      CONTINUE these medications which have CHANGED   Details  rosuvastatin (CRESTOR) 40 MG tablet Take 1 tablet (40 mg total) by mouth daily. Qty: 30 tablet, Refills: 1      CONTINUE these medications which have NOT CHANGED   Details  chlorthalidone (HYGROTON) 25 MG tablet Take 0.5 tablets (12.5 mg total) by mouth daily. Qty: 90 tablet, Refills: 3    clopidogrel (PLAVIX) 75 MG tablet Take 1 tablet (75 mg total) by mouth daily. Qty: 90 tablet, Refills: 3    enalapril (VASOTEC) 20 MG tablet Take 1 tablet (20 mg total) by mouth 2 (two) times daily. Qty: 180 tablet, Refills: 0    Ibuprofen-Diphenhydramine Cit (ADVIL PM PO) Take 2 tablets by mouth daily as needed (sleep).    lansoprazole (PREVACID) 30 MG capsule Take 30 mg by mouth daily.      methocarbamol (ROBAXIN) 500 MG tablet Take 500 mg by mouth 3 (three) times daily.    metoprolol (TOPROL-XL) 200 MG 24 hr tablet Take 1 tablet (200 mg total) by mouth daily. Qty: 90 tablet, Refills: 0    nitroGLYCERIN (NITROSTAT) 0.3 MG SL tablet Place 0.3 mg under the tongue every 5 (five) minutes as needed for chest pain.    oxyCODONE-acetaminophen (PERCOCET) 7.5-325 MG tablet Take 1 tablet by mouth every 6 (six) hours as needed for moderate pain.        No Known Allergies Follow-up Information    Follow up with Rollene Rotunda, MD.   Specialty:  Cardiology   Why:  The office scheduler will call you on Monday with the follow up appt. date and time.   Contact information:   [address] [place] [address] [phone]        The results of significant diagnostics from this hospitalization (including imaging, microbiology, ancillary and laboratory) are listed below for reference.    Significant Diagnostic Studies: Dg Chest 2 View  01/22/2015  CLINICAL DATA:   Chest tightness and pain for several weeks, worse today. EXAM: CHEST  2 VIEW COMPARISON:  04/22/2005. FINDINGS: Trachea is midline. Heart size normal. Lungs are clear. No pleural fluid. A single Harrington rod traverses the thoracolumbar spine. Flowing anterior osteophytosis in the thoracic spine. IMPRESSION: No acute findings. Electronically Signed   By: Leanna Battles M.D.   On: 01/22/2015 20:12    Microbiology: No results found for this or any previous visit (from the past 240 hour(s)).   Labs: Basic Metabolic Panel:  Recent Labs Lab 01/22/15 1930 01/23/15 0322 01/23/15 0925 01/24/15 0438  NA 135  --  139 138  K 3.5  --  3.6 3.7  CL 96*  --  97* 98*  CO2 31  --  34* 33*  GLUCOSE 144*  --  130* 135*  BUN 13  --  10 12  CREATININE 1.23 0.95 0.91 0.93  CALCIUM 9.1  --  9.2 8.8*  MG  --  1.8  --   --    Liver Function Tests: No results for input(s): AST, ALT, ALKPHOS, BILITOT, PROT, ALBUMIN in the last 168 hours. No results for input(s): LIPASE, AMYLASE in the last 168 hours. No results for input(s): AMMONIA in the last 168 hours. CBC:  Recent Labs Lab 01/22/15 1930 01/23/15 0322 01/24/15 0438  WBC 9.3 8.3 7.2  HGB 14.6 13.5 13.5  HCT 41.1 37.8* 39.0  MCV 85.1 84.6 86.7  PLT 255 210 209   Cardiac Enzymes:  Recent Labs Lab 01/23/15 0322 01/23/15 0925  TROPONINI <0.03 <0.03   BNP: BNP (last 3 results)  Recent Labs  01/23/15 0322  BNP 33.6    ProBNP (last 3 results) No results for input(s): PROBNP in the last 8760 hours.  CBG: No results for input(s): GLUCAP in the last 168 hours.     SignedChristiane Ha  [facility] 01/24/2015, 8:43 AM

## 2015-01-24 NOTE — Progress Notes (Signed)
Subjective: No complaints. Ready to go.  Objective: Vital signs in last 24 hours: Temp:  [97.6 F (36.4 C)-98.1 F (36.7 C)] 97.7 F (36.5 C) (12/10 0805) Pulse Rate:  [64-93] 90 (12/10 0805) Resp:  [11-22] 16 (12/10 0805) BP: (84-147)/(36-92) 147/89 mmHg (12/10 0805) SpO2:  [95 %-100 %] 100 % (12/10 0805) Weight:  [175 lb 7.8 oz (79.6 kg)] 175 lb 7.8 oz (79.6 kg) (12/10 0430) Last BM Date: 01/22/15  Intake/Output from previous day: 12/09 0701 - 12/10 0700 In: 1087.5 [P.O.:600; I.V.:487.5] Out: -  Intake/Output this shift: Total I/O In: 280 [P.O.:280] Out: -   Medications Scheduled Meds: . aspirin  81 mg Oral Daily  . chlorthalidone  12.5 mg Oral Daily  . clopidogrel  75 mg Oral QHS  . enalapril  20 mg Oral BID  . heparin  5,000 Units Subcutaneous 3 times per day  . methocarbamol  500 mg Oral 3 times per day  . metoprolol tartrate  50 mg Oral BID  . pantoprazole  40 mg Oral Daily  . rosuvastatin  20 mg Oral q1800   Continuous Infusions:  PRN Meds:.acetaminophen, morphine injection, ondansetron (ZOFRAN) IV, oxyCODONE-acetaminophen  PE: General appearance: alert, cooperative and no distress Lungs: clear to auscultation bilaterally Heart: regular rate and rhythm, S1, S2 normal, no murmur, click, rub or gallop Extremities: No LEE Pulses: 2+ and symmetric Skin: Warm and dry.  Radial cath site stable. Nontender, no hematoma Neurologic: Grossly normal  Lab Results:   Recent Labs  01/22/15 1930 01/23/15 0322 01/24/15 0438  WBC 9.3 8.3 7.2  HGB 14.6 13.5 13.5  HCT 41.1 37.8* 39.0  PLT 255 210 209   BMET  Recent Labs  01/22/15 1930 01/23/15 0322 01/23/15 0925 01/24/15 0438  NA 135  --  139 138  K 3.5  --  3.6 3.7  CL 96*  --  97* 98*  CO2 31  --  34* 33*  GLUCOSE 144*  --  130* 135*  BUN 13  --  10 12  CREATININE 1.23 0.95 0.91 0.93  CALCIUM 9.1  --  9.2 8.8*   PT/INR  Recent Labs  01/23/15 0322  LABPROT 14.4  INR 1.10    Cholesterol  Recent Labs  01/23/15 1025  CHOL 162   Lipid Panel     Component Value Date/Time   CHOL 162 01/23/2015 1025   TRIG 549* 01/23/2015 1025   HDL 24* 01/23/2015 1025   CHOLHDL 6.8 01/23/2015 1025   VLDL UNABLE TO CALCULATE IF TRIGLYCERIDE OVER 400 mg/dL 40/98/119112/10/2014 [id number]   LDLCALC UNABLE TO CALCULATE IF TRIGLYCERIDE OVER 400 mg/dL 95/62/130812/10/2014 [id number]    Cardiac Panel (last 3 results)  Recent Labs  01/23/15 0322 01/23/15 0925  TROPONINI <0.03 <0.03   LHC Conclusion    1. Triple vessel CAD.  2. The LAD has moderate diffuse disease proximally, none of which appears to be flow limiting. There is a moderate caliber diagonal branch with ostial 80% stenosis. Based on prior caths, this has not changed and is not felt to be responsible for his change in clinical symptoms.  3. The Circumflex terminates into an OM branch. The proximal vessel has a 95% stenosis just before the stented segment. There is diffuse 30% restenosis in the stented segment (2 layers of stents). Just beyond the stent there is a focal 95% stenosis in the mid vessel.  4. The RCA is a large dominant vessel with a mid stented segment (2 layers of stents, most recently  Cypher DES in 2007). The stented segment has diffuse 60-70% restenosis. None of these lesions are severe.  5. Mild LV systolic dysfunction 6. Successful PTCA/DES x 1 proximal Circumflex 7. Successful PTCA/DES x 1 mid Circumflex.   Recommendations: Continue ASA and Plavix for lifetime. If he has recurrent chest pain, consider FFR of RCA although treatment of the stented segment would likely neccesitate a third layer of stent. The Diagonal branch has been diseased for years and described as such in cath reports dating back to 2005.         Assessment/Plan    Active Problems:   Hyperlipidemia   Essential hypertension   Coronary atherosclerosis   Chest pain   Hypertensive heart disease with heart failure (HCC)   Unstable angina  (HCC)  1. Exertional anginal pain - Ongoing for the past week.  - EKG non ischemic. Trop x 3 negative.  SP LHC yesterday revealing: 80% OSTIAL d1, 95% Crx stenosis prox and distal to previous stent,  20% prox RCA, 65%ISRS RCA, two-60% distal RCA lesions.  He underwent stenting to the Crx lesions. ASA, plavix lifetime.  BB, statin   2. CAD - Multiple stent in the past.  - Echo 07/2012 showed - Normal left ventricular function. Mild mitral regurgitation. Mild pulmonary hypertension.  3 HTN -He had a couple hyoptensive BPs earlier this AMbut now 147/89.  Consider increasing lopressor in the office. .   4. Treated sleep apnea - Per Dr. Jenene Slicker last note 03/18/2014 plan was to set up sleep study. However, never followed up. Will set up as an outpatient.  5. HL TG 565.  We discussed dietary changes.  She said when he went very low carb before, and lost 30#, he felt terrible.  He will try something in between. Increase crestor to max dose. Add Lovaza      LOS: 1 day    HAGER, [name] PA-C 01/24/2015 8:08 AM  Patient seen and discussed with PA Leron Croak, I agree with his documentation. Continue medical therapy for recent stent to LCX. Bp's somewhat up and down, as outpatient can consider ACE-I for additional CV benefits. Ok for discharge today.   Dominga Ferry MD

## 2015-01-24 NOTE — Progress Notes (Signed)
Patient ambulated 200 ft with out any difficulties, no c/o chest pain, shortness of breath

## 2015-01-26 MED FILL — Heparin Sodium (Porcine) 2 Unit/ML in Sodium Chloride 0.9%: INTRAMUSCULAR | Qty: 1000 | Status: AC

## 2015-01-26 MED FILL — Morphine Sulfate Inj 2 MG/ML: INTRAMUSCULAR | Qty: 1 | Status: AC

## 2015-01-26 MED FILL — Lidocaine HCl Local Preservative Free (PF) Inj 1%: INTRAMUSCULAR | Qty: 30 | Status: AC

## 2015-02-07 ENCOUNTER — Other Ambulatory Visit: Payer: Self-pay | Admitting: Cardiology

## 2015-02-10 NOTE — Telephone Encounter (Signed)
REFILL 

## 2015-02-12 ENCOUNTER — Ambulatory Visit: Payer: BLUE CROSS/BLUE SHIELD | Admitting: Cardiology

## 2015-02-17 ENCOUNTER — Ambulatory Visit (INDEPENDENT_AMBULATORY_CARE_PROVIDER_SITE_OTHER): Payer: BLUE CROSS/BLUE SHIELD | Admitting: Nurse Practitioner

## 2015-02-17 ENCOUNTER — Encounter: Payer: Self-pay | Admitting: Nurse Practitioner

## 2015-02-17 VITALS — BP 118/78 | HR 92 | Ht 64.0 in | Wt 170.5 lb

## 2015-02-17 DIAGNOSIS — E782 Mixed hyperlipidemia: Secondary | ICD-10-CM | POA: Diagnosis not present

## 2015-02-17 DIAGNOSIS — I209 Angina pectoris, unspecified: Secondary | ICD-10-CM

## 2015-02-17 DIAGNOSIS — I119 Hypertensive heart disease without heart failure: Secondary | ICD-10-CM | POA: Diagnosis not present

## 2015-02-17 DIAGNOSIS — I25110 Atherosclerotic heart disease of native coronary artery with unstable angina pectoris: Secondary | ICD-10-CM

## 2015-02-17 DIAGNOSIS — I2510 Atherosclerotic heart disease of native coronary artery without angina pectoris: Secondary | ICD-10-CM

## 2015-02-17 DIAGNOSIS — I5022 Chronic systolic (congestive) heart failure: Secondary | ICD-10-CM

## 2015-02-17 MED ORDER — BENZONATATE 100 MG PO CAPS: 100.0000 mg | ORAL_CAPSULE | Freq: Three times a day (TID) | ORAL | Status: DC | PRN

## 2015-02-17 MED ORDER — BENZONATATE 100 MG PO CAPS: 100.0000 mg | ORAL_CAPSULE | Freq: Two times a day (BID) | ORAL | Status: DC | PRN

## 2015-02-17 NOTE — Patient Instructions (Addendum)
Medication Instructions:  Your physician has recommended you make the following change in your medication:  1.  START the Tessalon 100 mg taking 1 tablet three times a day as needed for cough   Labwork: Your physician recommends that you return for a FASTING lipid profile and LFT in 6 weeks   Testing/Procedures: None ordered  Follow-Up: Your physician recommends that you follow-up with Dr. Antoine PocheHochrein in 3 MONTHS  Any Other Special Instructions Will Be Listed Below (If Applicable).   If you need a refill on your cardiac medications before your next appointment, please call your pharmacy.

## 2015-02-17 NOTE — Progress Notes (Signed)
Office Visit    Patient Name: [name] BoucheBobby Job Date of Encounter: 02/17/2015  Primary Care Provider:  Noni SaupeEDDING II,JOHN F., MD Primary Cardiologist:  Shela CommonsJ. Hochrein, MD   Chief Complaint    55 y/o male with a h/o CAD s/p multiple interventions involving the LCX and RCA, who presents for f/u after recent repeat LCX stenting.  Past Medical History    Past Medical History  Diagnosis Date  . Coronary artery disease     a. 2000 MI/stent to RCA & CFX;  b. 04/2005 PCI: pRCA stent 95% ISR (Cypher DES), L-R collats, EF 55%;  c. 04/[facility] [place]: PCI of an occluded circumflex (2.5 x 28 mm DES). RCA 50-60% ISR, LAD 40-50%; d. 01/2015 Cath/PCI: LM nl, LAD 40p, D1 80, LCX 6841m (3.0x12 Synergy DES), 95d (2.5x12 Syndergy DES), RCA 65p/m ISR, 60/60d, EF 45-50%.  . Hypercholesterolemia with hypertriglyceridemia   . Hypertensive heart disease   . Obesity   . CHF (congestive heart failure) (HCC)     a. Dx 2006; b. 01/2015 EF 45-50% by LV gram.  . Sleep apnea   . History of blood transfusion 1978    "when I had Harrington rod placement"  . GERD (gastroesophageal reflux disease)   . DDD (degenerative disc disease)   . Arthritis     "shoulders" (01/22/2015)  . Sciatic leg pain     "left side"  . Chronic lower back pain   . Scoliosis    Past Surgical History  Procedure Laterality Date  . Tonsillectomy    . Spine surgery  1978    "Harrington rod placement"  . Back surgery    . Lumbar disc surgery      1990's?  . Cardiac catheterization  2006  . Coronary angioplasty with stent placement  ~ 2001 - 04/2012 X 4    "I've had a total of 4 placed; one is inside another" (01/22/2015)  . Cardiac catheterization N/A 01/23/2015    Procedure: Left Heart Cath and Coronary Angiography;  Surgeon: Kathleene Hazelhristopher D McAlhany, MD;  [facility];  Service: Cardiovascular;  Laterality: N/A;  . Cardiac catheterization N/A 01/23/2015    Procedure: Coronary Stent  Intervention;  Surgeon: Kathleene Hazelhristopher D McAlhany, MD;  [facility];  Service: Cardiovascular;  Laterality: N/A;    Allergies  No Known Allergies  History of Present Illness    55 year old male with history of CAD status post multiple prior percutaneous interventions as outlined above. [facility] early December with complaints of chest pain similar to prior angina. He ruled out for MI. He underwent diagnostic catheterization revealing severe stenosis at the proximal and as well as the distal end of present placed circumflex stents. These areas were successfully treated with Synergy drug-eluting stents and he was subsequently discharged home the following day. He remains on aspirin and Plavix therapy and has been compliant. Since his procedure, he has not had any chest pain or dyspnea on exertion and has been walking to 3 miles daily. He has considered cardiac rehabilitation but does not think that will fit into his schedule and instead plans to walk on his own. He denies PND, orthopnea, dizziness, syncope, edema, or early satiety. His right wrist has healed well.  Home Medications    Prior to Admission medications   Medication Sig Start Date End Date Taking? Authorizing Provider  aspirin 81 MG chewable tablet Chew 1 tablet (81 mg  total) by mouth daily. 01/24/15  Yes Christiane Ha, MD  chlorthalidone (HYGROTON) 25 MG tablet Take 0.5 tablets (12.5 mg total) by mouth daily. 07/02/14  Yes Rollene Rotunda, MD  clopidogrel (PLAVIX) 75 MG tablet Take 1 tablet (75 mg total) by mouth daily. 04/05/13  Yes Rollene Rotunda, MD  enalapril (VASOTEC) 20 MG tablet Take 1 tablet (20 mg total) by mouth 2 (two) times daily. 10/13/14  Yes Rollene Rotunda, MD  Ibuprofen-Diphenhydramine Cit (ADVIL PM PO) Take 2 tablets by mouth daily as needed (sleep).   Yes Historical Provider, MD  lansoprazole (PREVACID) 30 MG capsule Take 30 mg by mouth daily.     Yes Historical Provider,  MD  methocarbamol (ROBAXIN) 500 MG tablet Take 500 mg by mouth 3 (three) times daily.   Yes Historical Provider, MD  metoprolol (TOPROL-XL) 200 MG 24 hr tablet Take 1 tablet (200 mg total) by mouth daily. NEED OV. 02/10/15  Yes Lewayne Bunting, MD  nitroGLYCERIN (NITROSTAT) 0.3 MG SL tablet Place 0.3 mg under the tongue every 5 (five) minutes as needed for chest pain.   Yes Historical Provider, MD  omega-3 acid ethyl esters (LOVAZA) 1 G capsule Take 2 capsules (2 g total) by mouth 2 (two) times daily. 01/24/15  Yes Christiane Ha, MD  oxyCODONE-acetaminophen (PERCOCET) 7.5-325 MG tablet Take 1 tablet by mouth every 6 (six) hours as needed for moderate pain.  03/21/15  Yes Historical Provider, MD  rosuvastatin (CRESTOR) 40 MG tablet Take 1 tablet (40 mg total) by mouth daily. 01/24/15  Yes Christiane Ha, MD  benzonatate (TESSALON) 100 MG capsule Take 1 capsule (100 mg total) by mouth 3 (three) times daily as needed for cough. 02/17/15   Ok Anis, NP    Review of Systems    As above, he has been doing well without chest pain, palpitations, dyspnea, PND, orthopnea, dizziness, syncope, edema, or early satiety.  All other systems reviewed and are otherwise negative except as noted above.  Physical Exam    VS:  BP 118/78 mmHg  Pulse 92  Ht 5\' 4"  (1.626 m)  Wt 170 lb 8 oz (77.338 kg)  BMI 29.25 kg/m2 , BMI Body mass index is 29.25 kg/(m^2). GEN: Well nourished, well developed, in no acute distress. HEENT: normal. Neck: Supple, no JVD, carotid bruits, or masses. Cardiac: RRR, no murmurs, rubs, or gallops. No clubbing, cyanosis, edema.  Radials/DP/PT 2+ and equal bilaterally. Right wrist catheterization site is clear without bleeding, bruit, or hematoma. Respiratory:  Respirations regular and unlabored, clear to auscultation bilaterally. GI: Soft, nontender, nondistended, BS + x 4. MS: no deformity or atrophy. Skin: warm and dry, no rash. Neuro:  Strength and sensation are  intact. Psych: Normal affect.  Accessory Clinical Findings    ECG - regular sinus rhythm, left axis, prior inferior infarct, no acute ST or T changes.  Assessment & Plan    1.  Coronary artery disease: Status post recent hospitalization setting of unstable angina or catheterization revealed severe stenosis of the proximal and distal stent edges in the left circumflex. Both areas were successfully treated with Synergy drug eluting stents. He has moderate nonobstructive disease throughout the previously placed right coronary artery stent. He has not been having any chest pain or dyspnea. It has been recommended that if he has recurrent symptoms, fractional flow reserve down the RCA could be considered. He remains on aspirin, Plavix, beta blocker, ACE inhibitor, and statin therapy.  2. Hypertensive heart disease: Blood pressure stable.  Continue beta blocker, ACE inhibitor, and chlorthalidone therapy.  3. Hyperlipidemia and hypertriglyceridemia: His Crestor dose was increased to 40 mg daily during his hospitalization. I will arrange for follow-up lipids and LFTs in approximately 6 weeks.  4. Morbid obesity: Patient is 5 foot 4 and 170 pounds. We discussed the importance of regular activity and calorie restriction with a goal of weight loss. He is walking 3 miles most days of the week currently and says he just needs the willpower to diet successfully. We did discuss the utilization of smart phone applications that assist in calorie counting and awareness. He will consider this.  5. Chronic systolic CHF/ICM:  EF 45-50% by recent LV gram.  Euvolemic on exam and asymptomatic.  Cont bb/acei.  6.  Dispo:  Follow-up lipids and LFTs in 6 weeks. Follow-up with Dr. Antoine Poche in 3 months or sooner if necessary.  Nicolasa Ducking, NP 02/17/2015, 4:05 PM

## 2015-03-18 ENCOUNTER — Other Ambulatory Visit: Payer: Self-pay | Admitting: *Deleted

## 2015-03-19 MED ORDER — CLOPIDOGREL BISULFATE 75 MG PO TABS: 75.0000 mg | ORAL_TABLET | Freq: Every day | ORAL | Status: DC

## 2015-03-19 NOTE — Telephone Encounter (Signed)
Pt still trying to get his generic Plavix,this have been going on since Monday. Pharmacist says they have been trying every day to contact you.Pt needs this asap please.

## 2015-03-19 NOTE — Telephone Encounter (Signed)
Rx request sent to pharmacy.  

## 2015-04-17 ENCOUNTER — Other Ambulatory Visit: Payer: Self-pay | Admitting: Cardiology

## 2015-05-08 ENCOUNTER — Other Ambulatory Visit: Payer: Self-pay | Admitting: Cardiology

## 2015-05-08 NOTE — Telephone Encounter (Signed)
Rx request sent to pharmacy.  

## 2015-05-15 ENCOUNTER — Encounter: Payer: Self-pay | Admitting: Cardiology

## 2015-05-15 ENCOUNTER — Ambulatory Visit (INDEPENDENT_AMBULATORY_CARE_PROVIDER_SITE_OTHER): Payer: BLUE CROSS/BLUE SHIELD | Admitting: Cardiology

## 2015-05-15 VITALS — BP 129/77 | HR 89 | Ht 64.0 in | Wt 174.0 lb

## 2015-05-15 DIAGNOSIS — I200 Unstable angina: Secondary | ICD-10-CM

## 2015-05-15 NOTE — Progress Notes (Signed)
HPI The patient presents for followup after recent hospitalization for unstable angina.  He has a history of multiple coronary interventions.  He was in the hospital most recently with moderate 80% diagonal stenosis, proximal 95% circumflex stenosis before the stented area and 95% stenosis just beyond the stent area.  He had patent stents in the right coronary with 60-70% stenosis. He underwent DES stenting of his proximal circumflex and DES stenting of his mid circumflex.  Since going home is relatively well. He has had no recurrent chest discomfort.  He is walking with his son. He denies any chest pressure, neck or arm discomfort. He's not having any palpitations, presyncope or syncope. He's having no new shortness of breath, PND or orthopnea. He has not had to use any nitroglycerin.   No Known Allergies  Current Outpatient Prescriptions  Medication Sig Dispense Refill  . aspirin 81 MG chewable tablet Chew 1 tablet (81 mg total) by mouth daily.    . chlorthalidone (HYGROTON) 25 MG tablet Take 0.5 tablets (12.5 mg total) by mouth daily. 90 tablet 3  . clopidogrel (PLAVIX) 75 MG tablet Take 1 tablet (75 mg total) by mouth daily. 90 tablet 0  . enalapril (VASOTEC) 20 MG tablet Take 1 tablet (20 mg total) by mouth 2 (two) times daily. 180 tablet 0  . Ibuprofen-Diphenhydramine Cit (ADVIL PM PO) Take 2 tablets by mouth daily as needed (sleep).    . lansoprazole (PREVACID) 30 MG capsule Take 30 mg by mouth daily.      . methocarbamol (ROBAXIN) 500 MG tablet Take 500 mg by mouth 3 (three) times daily.    . metoprolol (TOPROL-XL) 200 MG 24 hr tablet TAKE 1 TABLET BY MOUTH EVERY DAY 90 tablet 0  . nitroGLYCERIN (NITROSTAT) 0.3 MG SL tablet Place 0.3 mg under the tongue every 5 (five) minutes as needed for chest pain.    Marland Kitchen omega-3 acid ethyl esters (LOVAZA) 1 G capsule Take 2 capsules (2 g total) by mouth 2 (two) times daily. 30 capsule 1  . oxyCODONE-acetaminophen (PERCOCET) 7.5-325 MG tablet Take 1  tablet by mouth every 6 (six) hours as needed for moderate pain.     . rosuvastatin (CRESTOR) 20 MG tablet TAKE 1 TABLET EVERY DAY 90 tablet 0   No current facility-administered medications for this visit.    Past Medical History  Diagnosis Date  . Coronary artery disease     a. 2000 MI/stent to RCA & CFX;  b. 04/2005 PCI: pRCA stent 95% ISR (Cypher DES), L-R collats, EF 55%;  c. 04/2012 NSTEMI at [facility] in [place]: PCI of an occluded circumflex (2.5 x 28 mm DES). RCA 50-60% ISR, LAD 40-50%; d. 01/2015 Cath/PCI: LM nl, LAD 40p, D1 80, LCX 44m (3.0x12 Synergy DES), 95d (2.5x12 Syndergy DES), RCA 65p/m ISR, 60/60d, EF 45-50%.  . Hypercholesterolemia with hypertriglyceridemia   . Hypertensive heart disease   . Obesity   . Chronic systolic (congestive) heart failure (HCC)     a. Dx 2006; b. 01/2015 EF 45-50% by LV gram.  . Sleep apnea   . History of blood transfusion 1978    "when I had Harrington rod placement"  . GERD (gastroesophageal reflux disease)   . DDD (degenerative disc disease)   . Arthritis     "shoulders" (01/22/2015)  . Sciatic leg pain     "left side"  . Chronic lower back pain   . Scoliosis     Past Surgical History  Procedure Laterality Date  .  Tonsillectomy    . Spine surgery  1978    "Harrington rod placement"  . Back surgery    . Lumbar disc surgery      1990's?  . Cardiac catheterization  2006  . Coronary angioplasty with stent placement  ~ 2001 - 04/2012 X 4    "I've had a total of 4 placed; one is inside another" (01/22/2015)  . Cardiac catheterization N/A 01/23/2015    Procedure: Left Heart Cath and Coronary Angiography;  Surgeon: Kathleene Hazelhristopher D McAlhany, MD;  [facility];  Service: Cardiovascular;  Laterality: N/A;  . Cardiac catheterization N/A 01/23/2015    Procedure: Coronary Stent Intervention;  Surgeon: Kathleene Hazelhristopher D McAlhany, MD;  [facility];  Service: Cardiovascular;  Laterality: N/A;    ROS:  As  stated in the HPI and negative for all other systems.  PHYSICAL EXAM BP 129/77 mmHg  Pulse 89  Ht 5\' 4"  (1.626 m)  Wt 174 lb (78.926 kg)  BMI 29.85 kg/m2 GENERAL:  Well appearing NECK:  No jugular venous distention, waveform within normal limits, carotid upstroke brisk and symmetric, no bruits, no thyromegaly LUNGS:  Clear to auscultation bilaterally CHEST:  Unremarkable HEART:  PMI not displaced or sustained,S1 and S2 within normal limits, no S3, no S4, no clicks, no rubs, no murmurs ABD:  Flat, positive bowel sounds normal in frequency in pitch, no bruits, no rebound, no guarding, no midline pulsatile mass, no hepatomegaly, no splenomegaly EXT:  2 plus pulses throughout, no edema, no cyanosis no clubbing    ASSESSMENT AND PLAN  Coronary artery disease:  He remains on aspirin, Plavix, beta blocker, ACE inhibitor, and statin therapy.  We talked at length about weight control and secondary risk reduction.    Hypertensive heart disease:   The blood pressure is at target. No change in medications is indicated. We will continue with therapeutic lifestyle changes (TLC).  He will continue on the meds as listed.    Hyperlipidemia and hypertriglyceridemia: His Crestor dose was increased to 40 mg daily during his hospitalization. He will be seeing his primary provider cinnamon edema written instructions. This will include direct LDL. He needs enzymes as well.  Chronic systolic CHF/ICM: EF 45-50% by recent LV gram.I will followup with an echo in the months to come.

## 2015-05-15 NOTE — Patient Instructions (Signed)
Your physician recommends that you schedule a follow-up appointment in: 3 Months  Have primary care physician check a Direct LDL

## 2015-05-25 DIAGNOSIS — H40013 Open angle with borderline findings, low risk, bilateral: Secondary | ICD-10-CM | POA: Diagnosis not present

## 2015-06-16 ENCOUNTER — Other Ambulatory Visit: Payer: Self-pay | Admitting: *Deleted

## 2015-06-16 MED ORDER — CLOPIDOGREL BISULFATE 75 MG PO TABS: 75.0000 mg | ORAL_TABLET | Freq: Every day | ORAL | Status: DC

## 2015-07-16 ENCOUNTER — Other Ambulatory Visit: Payer: Self-pay | Admitting: Cardiology

## 2015-07-17 ENCOUNTER — Other Ambulatory Visit: Payer: Self-pay | Admitting: Cardiology

## 2015-07-21 DIAGNOSIS — M4806 Spinal stenosis, lumbar region: Secondary | ICD-10-CM | POA: Diagnosis not present

## 2015-07-21 DIAGNOSIS — M47897 Other spondylosis, lumbosacral region: Secondary | ICD-10-CM | POA: Diagnosis not present

## 2015-07-21 DIAGNOSIS — M961 Postlaminectomy syndrome, not elsewhere classified: Secondary | ICD-10-CM | POA: Diagnosis not present

## 2015-08-06 ENCOUNTER — Other Ambulatory Visit: Payer: Self-pay | Admitting: Cardiology

## 2015-08-06 NOTE — Telephone Encounter (Signed)
Rx(s) sent to pharmacy electronically.  

## 2015-08-11 ENCOUNTER — Encounter: Payer: Self-pay | Admitting: Cardiology

## 2015-08-11 ENCOUNTER — Ambulatory Visit (INDEPENDENT_AMBULATORY_CARE_PROVIDER_SITE_OTHER): Payer: BLUE CROSS/BLUE SHIELD | Admitting: Cardiology

## 2015-08-11 VITALS — BP 121/75 | HR 90 | Ht 64.0 in | Wt 175.8 lb

## 2015-08-11 DIAGNOSIS — E782 Mixed hyperlipidemia: Secondary | ICD-10-CM | POA: Diagnosis not present

## 2015-08-11 DIAGNOSIS — I2510 Atherosclerotic heart disease of native coronary artery without angina pectoris: Secondary | ICD-10-CM | POA: Diagnosis not present

## 2015-08-11 MED ORDER — NITROGLYCERIN 0.3 MG SL SUBL: 0.3000 mg | SUBLINGUAL_TABLET | SUBLINGUAL | Status: DC | PRN

## 2015-08-11 MED ORDER — ENALAPRIL MALEATE 20 MG PO TABS: 20.0000 mg | ORAL_TABLET | Freq: Two times a day (BID) | ORAL | Status: DC

## 2015-08-11 NOTE — Progress Notes (Signed)
HPI The patient presents for followup after recent hospitalization for unstable angina.  He has a history of multiple coronary interventions.  He was in the hospital most recently with moderate 80% diagonal stenosis, proximal 95% circumflex stenosis before the stented area and 95% stenosis just beyond the stent area.  He had patent stents in the right coronary with 60-70% stenosis. He underwent DES stenting of his proximal circumflex and DES stenting of his mid circumflex.  At the last visit he was doing well.  He returns for followup.  Since I last saw him he has done well.  The patient denies any new symptoms such as chest discomfort, neck or arm discomfort. There has been no new shortness of breath, PND or orthopnea. There have been no reported palpitations, presyncope or syncope.  He does some walking but isn't exercising as much as I would like.  He did by a stationary bike.  No Known Allergies  Current Outpatient Prescriptions  Medication Sig Dispense Refill  . aspirin 81 MG chewable tablet Chew 1 tablet (81 mg total) by mouth daily.    . chlorthalidone (HYGROTON) 25 MG tablet TAKE 1/2 TABLET (12.5 MG TOTAL) BY MOUTH DAILY. 90 tablet 2  . clopidogrel (PLAVIX) 75 MG tablet Take 1 tablet (75 mg total) by mouth daily. 90 tablet 0  . enalapril (VASOTEC) 20 MG tablet Take 1 tablet (20 mg total) by mouth 2 (two) times daily. 180 tablet 2  . Ibuprofen-Diphenhydramine Cit (ADVIL PM PO) Take 2 tablets by mouth daily as needed (sleep).    . lansoprazole (PREVACID) 30 MG capsule Take 30 mg by mouth daily.      . methocarbamol (ROBAXIN) 500 MG tablet Take 500 mg by mouth 3 (three) times daily.    . metoprolol (TOPROL-XL) 200 MG 24 hr tablet TAKE 1 TABLET BY MOUTH EVERY DAY 90 tablet 3  . nitroGLYCERIN (NITROSTAT) 0.3 MG SL tablet Place 1 tablet (0.3 mg total) under the tongue every 5 (five) minutes as needed for chest pain. 25 tablet 1  . omega-3 acid ethyl esters (LOVAZA) 1 G capsule Take 2 capsules  (2 g total) by mouth 2 (two) times daily. 30 capsule 1  . oxyCODONE-acetaminophen (PERCOCET) 7.5-325 MG tablet Take 1 tablet by mouth every 6 (six) hours as needed for moderate pain.     . rosuvastatin (CRESTOR) 20 MG tablet TAKE 1 TABLET EVERY DAY 90 tablet 3   No current facility-administered medications for this visit.    Past Medical History  Diagnosis Date  . Coronary artery disease     a. 2000 MI/stent to RCA & CFX;  b. 04/2005 PCI: pRCA stent 95% ISR (Cypher DES), L-R collats, EF 55%;  c. [facility] [place]: PCI of an occluded circumflex (2.5 x 28 mm DES). RCA 50-60% ISR, LAD 40-50%; d. 01/2015 Cath/PCI: LM nl, LAD 40p, D1 80, LCX 2253m (3.0x12 Synergy DES), 95d (2.5x12 Syndergy DES), RCA 65p/m ISR, 60/60d, EF 45-50%.  . Hypercholesterolemia with hypertriglyceridemia   . Hypertensive heart disease   . Obesity   . Chronic systolic (congestive) heart failure (HCC)     a. Dx 2006; b. 01/2015 EF 45-50% by LV gram.  . Sleep apnea   . History of blood transfusion 1978    "when I had Harrington rod placement"  . GERD (gastroesophageal reflux disease)   . DDD (degenerative disc disease)   . Arthritis     "shoulders" (01/22/2015)  . Sciatic leg pain     "  left side"  . Chronic lower back pain   . Scoliosis     Past Surgical History  Procedure Laterality Date  . Tonsillectomy    . Spine surgery  1978    "Harrington rod placement"  . Back surgery    . Lumbar disc surgery      1990's?  . Cardiac catheterization  2006  . Coronary angioplasty with stent placement  ~ 2001 - 04/2012 X 4    "I've had a total of 4 placed; one is inside another" (01/22/2015)  . Cardiac catheterization N/A 01/23/2015    Procedure: Left Heart Cath and Coronary Angiography;  Surgeon: Kathleene Hazelhristopher D McAlhany, MD;  [facility];  Service: Cardiovascular;  Laterality: N/A;  . Cardiac catheterization N/A 01/23/2015    Procedure: Coronary Stent Intervention;   Surgeon: Kathleene Hazelhristopher D McAlhany, MD;  [facility];  Service: Cardiovascular;  Laterality: N/A;    ROS:  As stated in the HPI and negative for all other systems.  PHYSICAL EXAM BP 121/75 mmHg  Pulse 90  Ht 5\' 4"  (1.626 m)  Wt 175 lb 12.8 oz (79.742 kg)  BMI 30.16 kg/m2 GENERAL:  Well appearing NECK:  No jugular venous distention, waveform within normal limits, carotid upstroke brisk and symmetric, no bruits, no thyromegaly LUNGS:  Clear to auscultation bilaterally CHEST:  Unremarkable HEART:  PMI not displaced or sustained,S1 and S2 within normal limits, no S3, no S4, no clicks, no rubs, no murmurs ABD:  Flat, positive bowel sounds normal in frequency in pitch, no bruits, no rebound, no guarding, no midline pulsatile mass, no hepatomegaly, no splenomegaly EXT:  2 plus pulses throughout, no edema, no cyanosis no clubbing    ASSESSMENT AND PLAN  Coronary artery disease:  The patient has no new sypmtoms.  No further cardiovascular testing is indicated.  We will continue with aggressive risk reduction and meds as listed.  Hypertensive heart disease:   The blood pressure is at target. No change in medications is indicated. We will continue with therapeutic lifestyle changes (TLC).  He will continue on the meds as listed.    Hyperlipidemia and hypertriglyceridemia: [facility] Hart[facility]OHN F., [place] and I would like to review this.   Chronic systolic CHF/ICM: EF 45-50% by recent LV gram.I will followup with an echo in the months to come.

## 2015-08-11 NOTE — Patient Instructions (Signed)
Medication Instructions:  Continue current medications  Labwork: NONE  Testing/Procedures: NONE  Follow-Up: Your physician recommends that you schedule a follow-up appointment in: December   Any Other Special Instructions Will Be Listed Below (If Applicable).   If you need a refill on your cardiac medications before your next appointment, please call your pharmacy.

## 2015-08-13 ENCOUNTER — Ambulatory Visit: Payer: BLUE CROSS/BLUE SHIELD | Admitting: Cardiology

## 2015-08-15 ENCOUNTER — Other Ambulatory Visit: Payer: Self-pay | Admitting: Cardiology

## 2015-10-01 ENCOUNTER — Other Ambulatory Visit: Payer: Self-pay | Admitting: Cardiology

## 2015-10-01 NOTE — Telephone Encounter (Signed)
REFILL 

## 2015-10-21 DIAGNOSIS — M961 Postlaminectomy syndrome, not elsewhere classified: Secondary | ICD-10-CM | POA: Diagnosis not present

## 2015-10-21 DIAGNOSIS — M4806 Spinal stenosis, lumbar region: Secondary | ICD-10-CM | POA: Diagnosis not present

## 2015-10-21 DIAGNOSIS — Z6827 Body mass index (BMI) 27.0-27.9, adult: Secondary | ICD-10-CM | POA: Diagnosis not present

## 2015-11-03 DIAGNOSIS — Z0000 Encounter for general adult medical examination without abnormal findings: Secondary | ICD-10-CM | POA: Diagnosis not present

## 2015-11-10 DIAGNOSIS — Z2821 Immunization not carried out because of patient refusal: Secondary | ICD-10-CM | POA: Diagnosis not present

## 2015-11-10 DIAGNOSIS — Z0000 Encounter for general adult medical examination without abnormal findings: Secondary | ICD-10-CM | POA: Diagnosis not present

## 2015-12-08 DIAGNOSIS — H40013 Open angle with borderline findings, low risk, bilateral: Secondary | ICD-10-CM | POA: Diagnosis not present

## 2015-12-11 DIAGNOSIS — Z2821 Immunization not carried out because of patient refusal: Secondary | ICD-10-CM | POA: Diagnosis not present

## 2015-12-11 DIAGNOSIS — I10 Essential (primary) hypertension: Secondary | ICD-10-CM | POA: Diagnosis not present

## 2015-12-11 DIAGNOSIS — E1165 Type 2 diabetes mellitus with hyperglycemia: Secondary | ICD-10-CM | POA: Diagnosis not present

## 2015-12-11 DIAGNOSIS — Z1389 Encounter for screening for other disorder: Secondary | ICD-10-CM | POA: Diagnosis not present

## 2015-12-14 DIAGNOSIS — K219 Gastro-esophageal reflux disease without esophagitis: Secondary | ICD-10-CM | POA: Diagnosis not present

## 2015-12-16 ENCOUNTER — Telehealth: Payer: Self-pay | Admitting: *Deleted

## 2015-12-16 NOTE — Telephone Encounter (Signed)
Requesting surgical clearance:   1. Type of surgery: Colonoscopy/EGD  2. Surgeon: Tollie Pizzaimothy J Misenheimer  3. Surgical date: 01/12/2016  4. Medications that need to be help: Aspirin/Plavix  5. [facility]: Demetrius Charity(P) [phone] (F) [phone]   Pt saw you on 08/11/2015, Is pt cleared for surgery and how long can Plavix and Aspirin be held?

## 2015-12-21 NOTE — Telephone Encounter (Signed)
[facility]

## 2015-12-21 NOTE — Telephone Encounter (Signed)
He should not hold his ASA.  He can stop his Plavix for 5 days prior to the procedure if necessary.  However, I would like for him to resume the Plavix afterward until I see him back and make a decision about whether or not he will remain on DAPT.  He has had multiple recurrent caths and interventions.

## 2016-01-12 DIAGNOSIS — K317 Polyp of stomach and duodenum: Secondary | ICD-10-CM | POA: Diagnosis not present

## 2016-01-12 DIAGNOSIS — I509 Heart failure, unspecified: Secondary | ICD-10-CM | POA: Diagnosis not present

## 2016-01-12 DIAGNOSIS — Z7984 Long term (current) use of oral hypoglycemic drugs: Secondary | ICD-10-CM | POA: Diagnosis not present

## 2016-01-12 DIAGNOSIS — Z1211 Encounter for screening for malignant neoplasm of colon: Secondary | ICD-10-CM | POA: Diagnosis not present

## 2016-01-12 DIAGNOSIS — Z79899 Other long term (current) drug therapy: Secondary | ICD-10-CM | POA: Diagnosis not present

## 2016-01-12 DIAGNOSIS — M419 Scoliosis, unspecified: Secondary | ICD-10-CM | POA: Diagnosis not present

## 2016-01-12 DIAGNOSIS — E119 Type 2 diabetes mellitus without complications: Secondary | ICD-10-CM | POA: Diagnosis not present

## 2016-01-12 DIAGNOSIS — K449 Diaphragmatic hernia without obstruction or gangrene: Secondary | ICD-10-CM | POA: Diagnosis not present

## 2016-01-12 DIAGNOSIS — K222 Esophageal obstruction: Secondary | ICD-10-CM | POA: Diagnosis not present

## 2016-01-12 DIAGNOSIS — D122 Benign neoplasm of ascending colon: Secondary | ICD-10-CM | POA: Diagnosis not present

## 2016-01-12 DIAGNOSIS — Z86010 Personal history of colonic polyps: Secondary | ICD-10-CM | POA: Diagnosis not present

## 2016-01-12 DIAGNOSIS — K635 Polyp of colon: Secondary | ICD-10-CM | POA: Diagnosis not present

## 2016-01-12 DIAGNOSIS — K219 Gastro-esophageal reflux disease without esophagitis: Secondary | ICD-10-CM | POA: Diagnosis not present

## 2016-01-12 DIAGNOSIS — K2950 Unspecified chronic gastritis without bleeding: Secondary | ICD-10-CM | POA: Diagnosis not present

## 2016-01-18 DIAGNOSIS — Z6827 Body mass index (BMI) 27.0-27.9, adult: Secondary | ICD-10-CM | POA: Diagnosis not present

## 2016-01-18 DIAGNOSIS — M47897 Other spondylosis, lumbosacral region: Secondary | ICD-10-CM | POA: Diagnosis not present

## 2016-01-18 DIAGNOSIS — M961 Postlaminectomy syndrome, not elsewhere classified: Secondary | ICD-10-CM | POA: Diagnosis not present

## 2016-02-18 ENCOUNTER — Emergency Department (HOSPITAL_COMMUNITY): Payer: BLUE CROSS/BLUE SHIELD

## 2016-02-18 ENCOUNTER — Encounter (HOSPITAL_COMMUNITY): Payer: Self-pay | Admitting: Emergency Medicine

## 2016-02-18 ENCOUNTER — Inpatient Hospital Stay (HOSPITAL_COMMUNITY)
Admission: EM | Admit: 2016-02-18 | Discharge: 2016-02-20 | DRG: 247 | Disposition: A | Discharge status: Home / Self Care | Payer: BLUE CROSS/BLUE SHIELD | Attending: Internal Medicine | Admitting: Internal Medicine

## 2016-02-18 DIAGNOSIS — E7800 Pure hypercholesterolemia, unspecified: Secondary | ICD-10-CM | POA: Diagnosis not present

## 2016-02-18 DIAGNOSIS — Z7982 Long term (current) use of aspirin: Secondary | ICD-10-CM

## 2016-02-18 DIAGNOSIS — I110 Hypertensive heart disease with heart failure: Secondary | ICD-10-CM | POA: Diagnosis not present

## 2016-02-18 DIAGNOSIS — G4730 Sleep apnea, unspecified: Secondary | ICD-10-CM | POA: Diagnosis not present

## 2016-02-18 DIAGNOSIS — M419 Scoliosis, unspecified: Secondary | ICD-10-CM | POA: Diagnosis not present

## 2016-02-18 DIAGNOSIS — K219 Gastro-esophageal reflux disease without esophagitis: Secondary | ICD-10-CM | POA: Diagnosis not present

## 2016-02-18 DIAGNOSIS — E8881 Metabolic syndrome: Secondary | ICD-10-CM | POA: Diagnosis not present

## 2016-02-18 DIAGNOSIS — M545 Low back pain: Secondary | ICD-10-CM | POA: Diagnosis not present

## 2016-02-18 DIAGNOSIS — G8929 Other chronic pain: Secondary | ICD-10-CM | POA: Diagnosis not present

## 2016-02-18 DIAGNOSIS — Z79899 Other long term (current) drug therapy: Secondary | ICD-10-CM | POA: Diagnosis not present

## 2016-02-18 DIAGNOSIS — I25110 Atherosclerotic heart disease of native coronary artery with unstable angina pectoris: Secondary | ICD-10-CM | POA: Diagnosis not present

## 2016-02-18 DIAGNOSIS — R0789 Other chest pain: Secondary | ICD-10-CM | POA: Diagnosis not present

## 2016-02-18 DIAGNOSIS — I2510 Atherosclerotic heart disease of native coronary artery without angina pectoris: Secondary | ICD-10-CM | POA: Diagnosis not present

## 2016-02-18 DIAGNOSIS — Y838 Other surgical procedures as the cause of abnormal reaction of the patient, or of later complication, without mention of misadventure at the time of the procedure: Secondary | ICD-10-CM | POA: Diagnosis present

## 2016-02-18 DIAGNOSIS — T82858A Stenosis of vascular prosthetic devices, implants and grafts, initial encounter: Principal | ICD-10-CM | POA: Diagnosis present

## 2016-02-18 DIAGNOSIS — Z7902 Long term (current) use of antithrombotics/antiplatelets: Secondary | ICD-10-CM | POA: Diagnosis not present

## 2016-02-18 DIAGNOSIS — I255 Ischemic cardiomyopathy: Secondary | ICD-10-CM | POA: Diagnosis present

## 2016-02-18 DIAGNOSIS — I5022 Chronic systolic (congestive) heart failure: Secondary | ICD-10-CM | POA: Diagnosis not present

## 2016-02-18 DIAGNOSIS — Z955 Presence of coronary angioplasty implant and graft: Secondary | ICD-10-CM

## 2016-02-18 DIAGNOSIS — I200 Unstable angina: Secondary | ICD-10-CM | POA: Diagnosis not present

## 2016-02-18 DIAGNOSIS — E119 Type 2 diabetes mellitus without complications: Secondary | ICD-10-CM | POA: Diagnosis not present

## 2016-02-18 DIAGNOSIS — R079 Chest pain, unspecified: Secondary | ICD-10-CM | POA: Diagnosis not present

## 2016-02-18 DIAGNOSIS — I10 Essential (primary) hypertension: Secondary | ICD-10-CM | POA: Diagnosis not present

## 2016-02-18 DIAGNOSIS — E785 Hyperlipidemia, unspecified: Secondary | ICD-10-CM | POA: Diagnosis present

## 2016-02-18 LAB — CBC
HCT: 38.9 % — ABNORMAL LOW (ref 39.0–52.0)
Hemoglobin: 13.4 g/dL (ref 13.0–17.0)
MCH: 29.2 pg (ref 26.0–34.0)
MCHC: 34.4 g/dL (ref 30.0–36.0)
MCV: 84.7 fL (ref 78.0–100.0)
PLATELETS: 223 10*3/uL (ref 150–400)
RBC: 4.59 MIL/uL (ref 4.22–5.81)
RDW: 12.9 % (ref 11.5–15.5)
WBC: 8.1 10*3/uL (ref 4.0–10.5)

## 2016-02-18 LAB — I-STAT TROPONIN, ED: Troponin i, poc: 0.01 ng/mL (ref 0.00–0.08)

## 2016-02-18 LAB — BASIC METABOLIC PANEL
Anion gap: 10 (ref 5–15)
BUN: 17 mg/dL (ref 6–20)
CO2: 28 mmol/L (ref 22–32)
CREATININE: 1.09 mg/dL (ref 0.61–1.24)
Calcium: 9.1 mg/dL (ref 8.9–10.3)
Chloride: 101 mmol/L (ref 101–111)
GFR calc Af Amer: >60 mL/min (ref >60)
GFR calc non Af Amer: >60 mL/min (ref >60)
GLUCOSE: 123 mg/dL — AB (ref 65–99)
Potassium: 3.7 mmol/L (ref 3.5–5.1)
SODIUM: 139 mmol/L (ref 135–145)

## 2016-02-18 NOTE — ED Triage Notes (Signed)
Pt states "my chest has been getting tight for a few weeks, its gotten worse today, ive had a couple stents in my heart and a few mild heart attacks". Hx of CHF

## 2016-02-19 ENCOUNTER — Inpatient Hospital Stay (HOSPITAL_COMMUNITY): Payer: BLUE CROSS/BLUE SHIELD

## 2016-02-19 ENCOUNTER — Encounter (HOSPITAL_COMMUNITY)
Admission: EM | Disposition: A | Discharge status: Home / Self Care | Payer: Self-pay | Source: Home / Self Care | Attending: Internal Medicine

## 2016-02-19 DIAGNOSIS — I10 Essential (primary) hypertension: Secondary | ICD-10-CM

## 2016-02-19 DIAGNOSIS — G4730 Sleep apnea, unspecified: Secondary | ICD-10-CM | POA: Diagnosis present

## 2016-02-19 DIAGNOSIS — E7800 Pure hypercholesterolemia, unspecified: Secondary | ICD-10-CM | POA: Diagnosis present

## 2016-02-19 DIAGNOSIS — I200 Unstable angina: Secondary | ICD-10-CM

## 2016-02-19 DIAGNOSIS — K219 Gastro-esophageal reflux disease without esophagitis: Secondary | ICD-10-CM | POA: Diagnosis present

## 2016-02-19 DIAGNOSIS — I5022 Chronic systolic (congestive) heart failure: Secondary | ICD-10-CM | POA: Diagnosis present

## 2016-02-19 DIAGNOSIS — I110 Hypertensive heart disease with heart failure: Secondary | ICD-10-CM | POA: Diagnosis present

## 2016-02-19 DIAGNOSIS — E8881 Metabolic syndrome: Secondary | ICD-10-CM | POA: Diagnosis present

## 2016-02-19 DIAGNOSIS — I255 Ischemic cardiomyopathy: Secondary | ICD-10-CM | POA: Diagnosis present

## 2016-02-19 DIAGNOSIS — G8929 Other chronic pain: Secondary | ICD-10-CM | POA: Diagnosis present

## 2016-02-19 DIAGNOSIS — R079 Chest pain, unspecified: Secondary | ICD-10-CM | POA: Diagnosis not present

## 2016-02-19 DIAGNOSIS — E119 Type 2 diabetes mellitus without complications: Secondary | ICD-10-CM | POA: Diagnosis present

## 2016-02-19 DIAGNOSIS — I25110 Atherosclerotic heart disease of native coronary artery with unstable angina pectoris: Secondary | ICD-10-CM | POA: Diagnosis not present

## 2016-02-19 DIAGNOSIS — Z79899 Other long term (current) drug therapy: Secondary | ICD-10-CM | POA: Diagnosis not present

## 2016-02-19 DIAGNOSIS — Z7902 Long term (current) use of antithrombotics/antiplatelets: Secondary | ICD-10-CM | POA: Diagnosis not present

## 2016-02-19 DIAGNOSIS — Z7982 Long term (current) use of aspirin: Secondary | ICD-10-CM | POA: Diagnosis not present

## 2016-02-19 DIAGNOSIS — M545 Low back pain: Secondary | ICD-10-CM | POA: Diagnosis present

## 2016-02-19 DIAGNOSIS — M419 Scoliosis, unspecified: Secondary | ICD-10-CM | POA: Diagnosis present

## 2016-02-19 DIAGNOSIS — I2510 Atherosclerotic heart disease of native coronary artery without angina pectoris: Secondary | ICD-10-CM | POA: Diagnosis not present

## 2016-02-19 DIAGNOSIS — Z955 Presence of coronary angioplasty implant and graft: Secondary | ICD-10-CM | POA: Diagnosis not present

## 2016-02-19 DIAGNOSIS — Y838 Other surgical procedures as the cause of abnormal reaction of the patient, or of later complication, without mention of misadventure at the time of the procedure: Secondary | ICD-10-CM | POA: Diagnosis present

## 2016-02-19 DIAGNOSIS — T82858A Stenosis of vascular prosthetic devices, implants and grafts, initial encounter: Secondary | ICD-10-CM | POA: Diagnosis present

## 2016-02-19 HISTORY — PX: CARDIAC CATHETERIZATION: SHX172

## 2016-02-19 LAB — ECHOCARDIOGRAM COMPLETE
CHL CUP DOP CALC LVOT VTI: 18.9 cm
CHL CUP TV REG PEAK VELOCITY: 244 cm/s
E/e' ratio: 11.24
EWDT: 177 ms
FS: 29 % (ref 28–44)
Height: 64 in
IVS/LV PW RATIO, ED: 1.05
LA ID, A-P, ES: 26 mm
LA diam end sys: 26 mm
LA diam index: 1.45 cm/m2
LA vol A4C: 54 ml
LA vol index: 32.5 mL/m2
LAVOL: 58.4 mL
LDCA: 3.14 cm2
LV E/e' medial: 11.24
LV E/e'average: 11.24
LV PW d: 7.87 mm — AB (ref 0.6–1.1)
LV SIMPSON'S DISK: 47
LV TDI E'LATERAL: 8.81
LV e' LATERAL: 8.81 cm/s
LVDIAVOL: 127 mL (ref 62–150)
LVDIAVOLIN: 71 mL/m2
LVOT SV: 59 mL
LVOTD: 20 mm
LVOTPV: 92.5 cm/s
LVSYSVOL: 68 mL — AB (ref 21–61)
LVSYSVOLIN: 38 mL/m2
MV Dec: 177
MV Peak grad: 4 mmHg
MV pk A vel: 76.6 m/s
MV pk E vel: 99 m/s
RV LATERAL S' VELOCITY: 10.2 cm/s
RV TAPSE: 22.1 mm
RV sys press: 27 mmHg
Stroke v: 59 ml
TDI e' medial: 7.07
TR max vel: 244 cm/s
WEIGHTICAEL: 2480 [oz_av]

## 2016-02-19 LAB — CBC
HCT: 36.2 % — ABNORMAL LOW (ref 39.0–52.0)
Hemoglobin: 12.6 g/dL — ABNORMAL LOW (ref 13.0–17.0)
MCH: 29.2 pg (ref 26.0–34.0)
MCHC: 34.8 g/dL (ref 30.0–36.0)
MCV: 84 fL (ref 78.0–100.0)
PLATELETS: 198 10*3/uL (ref 150–400)
RBC: 4.31 MIL/uL (ref 4.22–5.81)
RDW: 12.8 % (ref 11.5–15.5)
WBC: 8.4 10*3/uL (ref 4.0–10.5)

## 2016-02-19 LAB — BASIC METABOLIC PANEL
Anion gap: 10 (ref 5–15)
BUN: 15 mg/dL (ref 6–20)
CALCIUM: 9.1 mg/dL (ref 8.9–10.3)
CO2: 29 mmol/L (ref 22–32)
CREATININE: 0.94 mg/dL (ref 0.61–1.24)
Chloride: 102 mmol/L (ref 101–111)
GFR calc Af Amer: >60 mL/min (ref >60)
Glucose, Bld: 108 mg/dL — ABNORMAL HIGH (ref 65–99)
POTASSIUM: 3.7 mmol/L (ref 3.5–5.1)
SODIUM: 141 mmol/L (ref 135–145)

## 2016-02-19 LAB — CBG MONITORING, ED: GLUCOSE-CAPILLARY: 127 mg/dL — AB (ref 65–99)

## 2016-02-19 LAB — PROTIME-INR
INR: 1.07
PROTHROMBIN TIME: 13.9 s (ref 11.4–15.2)

## 2016-02-19 LAB — POCT ACTIVATED CLOTTING TIME
ACTIVATED CLOTTING TIME: 274 s
ACTIVATED CLOTTING TIME: 301 s

## 2016-02-19 LAB — HEPARIN LEVEL (UNFRACTIONATED): HEPARIN UNFRACTIONATED: 0.23 [IU]/mL — AB (ref 0.30–0.70)

## 2016-02-19 LAB — MRSA PCR SCREENING: MRSA by PCR: NEGATIVE

## 2016-02-19 LAB — GLUCOSE, CAPILLARY
GLUCOSE-CAPILLARY: 140 mg/dL — AB (ref 65–99)
Glucose-Capillary: 101 mg/dL — ABNORMAL HIGH (ref 65–99)
Glucose-Capillary: 98 mg/dL (ref 65–99)

## 2016-02-19 SURGERY — LEFT HEART CATH AND CORONARY ANGIOGRAPHY
Anesthesia: LOCAL

## 2016-02-19 MED ORDER — SODIUM CHLORIDE 0.9% FLUSH: 3.0000 mL | INTRAVENOUS | Status: DC | PRN

## 2016-02-19 MED ORDER — HEPARIN (PORCINE) IN NACL 2-0.9 UNIT/ML-% IJ SOLN
INTRAMUSCULAR | Status: DC | PRN
  Administered 2016-02-19: 10 mL via INTRA_ARTERIAL

## 2016-02-19 MED ORDER — HEPARIN (PORCINE) IN NACL 100-0.45 UNIT/ML-% IJ SOLN
1200.0000 [IU]/h | INTRAMUSCULAR | Status: DC
  Administered 2016-02-19: 1000 [IU]/h via INTRAVENOUS
  Filled 2016-02-19 (×2): qty 250

## 2016-02-19 MED ORDER — IOPAMIDOL (ISOVUE-370) INJECTION 76%
INTRAVENOUS | Status: DC | PRN
Start: 2016-02-19 — End: 2016-02-19
  Administered 2016-02-19: 150 mL via INTRAVENOUS

## 2016-02-19 MED ORDER — INSULIN ASPART 100 UNIT/ML ~~LOC~~ SOLN
0.0000 [IU] | Freq: Three times a day (TID) | SUBCUTANEOUS | Status: DC
  Administered 2016-02-19 – 2016-02-20 (×2): 2 [IU] via SUBCUTANEOUS
  Filled 2016-02-19: qty 1

## 2016-02-19 MED ORDER — LIDOCAINE HCL (PF) 1 % IJ SOLN
INTRAMUSCULAR | Status: AC
  Filled 2016-02-19: qty 30

## 2016-02-19 MED ORDER — ROSUVASTATIN CALCIUM 20 MG PO TABS
20.0000 mg | ORAL_TABLET | Freq: Every day | ORAL | Status: DC
Start: 2016-02-19 — End: 2016-02-20
  Administered 2016-02-19: 20 mg via ORAL
  Filled 2016-02-19 (×2): qty 1

## 2016-02-19 MED ORDER — METOPROLOL SUCCINATE ER 50 MG PO TB24
200.0000 mg | ORAL_TABLET | Freq: Every day | ORAL | Status: DC
  Administered 2016-02-19: 200 mg via ORAL
  Filled 2016-02-19: qty 4

## 2016-02-19 MED ORDER — SODIUM CHLORIDE 0.9 % WEIGHT BASED INFUSION: 1.0000 mL/kg/h | INTRAVENOUS | Status: AC

## 2016-02-19 MED ORDER — SODIUM CHLORIDE 0.9 % IV SOLN: 250.0000 mL | INTRAVENOUS | Status: DC | PRN

## 2016-02-19 MED ORDER — FENTANYL CITRATE (PF) 100 MCG/2ML IJ SOLN
INTRAMUSCULAR | Status: AC
  Filled 2016-02-19: qty 2

## 2016-02-19 MED ORDER — HEPARIN SODIUM (PORCINE) 1000 UNIT/ML IJ SOLN
INTRAMUSCULAR | Status: AC
  Filled 2016-02-19: qty 1

## 2016-02-19 MED ORDER — METHOCARBAMOL 500 MG PO TABS
500.0000 mg | ORAL_TABLET | Freq: Three times a day (TID) | ORAL | Status: DC | PRN
  Filled 2016-02-19: qty 1

## 2016-02-19 MED ORDER — MIDAZOLAM HCL 2 MG/2ML IJ SOLN
INTRAMUSCULAR | Status: DC | PRN
  Administered 2016-02-19: 2 mg via INTRAVENOUS
  Administered 2016-02-19 (×2): 1 mg via INTRAVENOUS

## 2016-02-19 MED ORDER — NITROGLYCERIN 0.4 MG SL SUBL: 0.4000 mg | SUBLINGUAL_TABLET | SUBLINGUAL | Status: DC | PRN

## 2016-02-19 MED ORDER — PANTOPRAZOLE SODIUM 20 MG PO TBEC
20.0000 mg | DELAYED_RELEASE_TABLET | Freq: Every day | ORAL | Status: DC
  Filled 2016-02-19: qty 1

## 2016-02-19 MED ORDER — SODIUM CHLORIDE 0.9 % WEIGHT BASED INFUSION
3.0000 mL/kg/h | INTRAVENOUS | Status: DC
  Administered 2016-02-19: 3 mL/kg/h via INTRAVENOUS

## 2016-02-19 MED ORDER — FENTANYL CITRATE (PF) 100 MCG/2ML IJ SOLN
INTRAMUSCULAR | Status: DC | PRN
  Administered 2016-02-19 (×2): 50 ug via INTRAVENOUS
  Administered 2016-02-19 (×2): 25 ug via INTRAVENOUS

## 2016-02-19 MED ORDER — SODIUM CHLORIDE 0.9% FLUSH: 3.0000 mL | Freq: Two times a day (BID) | INTRAVENOUS | Status: DC

## 2016-02-19 MED ORDER — HEPARIN (PORCINE) IN NACL 2-0.9 UNIT/ML-% IJ SOLN
INTRAMUSCULAR | Status: AC
  Filled 2016-02-19: qty 1000

## 2016-02-19 MED ORDER — CLOPIDOGREL BISULFATE 75 MG PO TABS
75.0000 mg | ORAL_TABLET | Freq: Every day | ORAL | Status: DC
Start: 2016-02-19 — End: 2016-02-20
  Administered 2016-02-19: 75 mg via ORAL
  Filled 2016-02-19 (×2): qty 1

## 2016-02-19 MED ORDER — OXYCODONE-ACETAMINOPHEN 7.5-325 MG PO TABS
1.0000 | ORAL_TABLET | Freq: Four times a day (QID) | ORAL | Status: DC | PRN
  Administered 2016-02-19 (×3): 1 via ORAL
  Filled 2016-02-19 (×4): qty 1

## 2016-02-19 MED ORDER — IOPAMIDOL (ISOVUE-370) INJECTION 76%
INTRAVENOUS | Status: AC
  Filled 2016-02-19: qty 100

## 2016-02-19 MED ORDER — ASPIRIN EC 81 MG PO TBEC: 81.0000 mg | DELAYED_RELEASE_TABLET | Freq: Every day | ORAL | Status: DC

## 2016-02-19 MED ORDER — LIDOCAINE HCL (PF) 1 % IJ SOLN
INTRAMUSCULAR | Status: DC | PRN
  Administered 2016-02-19: 2 mL via INTRADERMAL

## 2016-02-19 MED ORDER — MIDAZOLAM HCL 2 MG/2ML IJ SOLN
INTRAMUSCULAR | Status: AC
  Filled 2016-02-19: qty 2

## 2016-02-19 MED ORDER — NITROGLYCERIN 0.3 MG SL SUBL: 0.3000 mg | SUBLINGUAL_TABLET | SUBLINGUAL | Status: DC | PRN

## 2016-02-19 MED ORDER — MORPHINE SULFATE (PF) 2 MG/ML IV SOLN: 2.0000 mg | INTRAVENOUS | Status: DC | PRN

## 2016-02-19 MED ORDER — NITROGLYCERIN 1 MG/10 ML FOR IR/CATH LAB
INTRA_ARTERIAL | Status: DC | PRN
  Administered 2016-02-19: 200 ug via INTRACORONARY

## 2016-02-19 MED ORDER — MORPHINE SULFATE (PF) 10 MG/ML IV SOLN
INTRAVENOUS | Status: DC | PRN
  Administered 2016-02-19 (×2): 2 mg via INTRAVENOUS

## 2016-02-19 MED ORDER — HEPARIN BOLUS VIA INFUSION
4000.0000 [IU] | Freq: Once | INTRAVENOUS | Status: AC
  Administered 2016-02-19: 4000 [IU] via INTRAVENOUS
  Filled 2016-02-19: qty 4000

## 2016-02-19 MED ORDER — ENALAPRIL MALEATE 20 MG PO TABS
20.0000 mg | ORAL_TABLET | Freq: Two times a day (BID) | ORAL | Status: DC
  Administered 2016-02-19 (×2): 20 mg via ORAL
  Filled 2016-02-19 (×3): qty 1

## 2016-02-19 MED ORDER — MORPHINE SULFATE (PF) 10 MG/ML IV SOLN
INTRAVENOUS | Status: AC
  Filled 2016-02-19: qty 1

## 2016-02-19 MED ORDER — LABETALOL HCL 5 MG/ML IV SOLN: 10.0000 mg | INTRAVENOUS | Status: AC | PRN

## 2016-02-19 MED ORDER — ASPIRIN 81 MG PO CHEW: 81.0000 mg | CHEWABLE_TABLET | Freq: Every day | ORAL | Status: DC

## 2016-02-19 MED ORDER — ANGIOPLASTY BOOK
Freq: Once | Status: AC
  Administered 2016-02-19: 21:00:00
  Filled 2016-02-19: qty 1

## 2016-02-19 MED ORDER — HEPARIN SODIUM (PORCINE) 1000 UNIT/ML IJ SOLN
INTRAMUSCULAR | Status: DC | PRN
Start: 2016-02-19 — End: 2016-02-19
  Administered 2016-02-19: 2000 [IU] via INTRAVENOUS
  Administered 2016-02-19 (×2): 3500 [IU] via INTRAVENOUS

## 2016-02-19 MED ORDER — ACETAMINOPHEN 325 MG PO TABS: 650.0000 mg | ORAL_TABLET | ORAL | Status: DC | PRN

## 2016-02-19 MED ORDER — NITROGLYCERIN 1 MG/10 ML FOR IR/CATH LAB
INTRA_ARTERIAL | Status: AC
  Filled 2016-02-19: qty 10

## 2016-02-19 MED ORDER — ONDANSETRON HCL 4 MG/2ML IJ SOLN: 4.0000 mg | Freq: Four times a day (QID) | INTRAMUSCULAR | Status: DC | PRN

## 2016-02-19 MED ORDER — SODIUM CHLORIDE 0.9 % WEIGHT BASED INFUSION
1.0000 mL/kg/h | INTRAVENOUS | Status: DC
  Administered 2016-02-19: 1 mL/kg/h via INTRAVENOUS

## 2016-02-19 MED ORDER — ASPIRIN 81 MG PO CHEW
324.0000 mg | CHEWABLE_TABLET | Freq: Once | ORAL | Status: AC
  Administered 2016-02-19: 324 mg via ORAL
  Filled 2016-02-19: qty 4

## 2016-02-19 MED ORDER — HEPARIN (PORCINE) IN NACL 2-0.9 UNIT/ML-% IJ SOLN
INTRAMUSCULAR | Status: DC | PRN
  Administered 2016-02-19: 1000 mL

## 2016-02-19 MED ORDER — HYDRALAZINE HCL 20 MG/ML IJ SOLN: 5.0000 mg | INTRAMUSCULAR | Status: AC | PRN

## 2016-02-19 MED ORDER — SODIUM CHLORIDE 0.9 % IV SOLN
INTRAVENOUS | Status: DC
  Administered 2016-02-19: 03:00:00 via INTRAVENOUS

## 2016-02-19 MED ORDER — CHLORTHALIDONE 25 MG PO TABS
25.0000 mg | ORAL_TABLET | Freq: Every day | ORAL | Status: DC
  Filled 2016-02-19: qty 1

## 2016-02-19 MED ORDER — VERAPAMIL HCL 2.5 MG/ML IV SOLN
INTRAVENOUS | Status: AC
  Filled 2016-02-19: qty 2

## 2016-02-19 MED ORDER — CHLORTHALIDONE 25 MG PO TABS
12.5000 mg | ORAL_TABLET | Freq: Every day | ORAL | Status: DC
  Administered 2016-02-19: 21:00:00 12.5 mg via ORAL
  Filled 2016-02-19: qty 0.5

## 2016-02-19 MED ORDER — ENALAPRIL MALEATE 20 MG PO TABS: 20.0000 mg | ORAL_TABLET | Freq: Two times a day (BID) | ORAL | Status: DC

## 2016-02-19 SURGICAL SUPPLY — 22 items
BALLN ANGIOSCULPT RX 3.0X15 (BALLOONS) ×2
BALLN EMERGE MR 2.5X12 (BALLOONS) ×2
BALLN ~~LOC~~ MOZEC 3.5X15 (BALLOONS) ×2
BALLOON ANGIOSCULPT RX 3.0X15 (BALLOONS) IMPLANT
BALLOON EMERGE MR 2.5X12 (BALLOONS) IMPLANT
BALLOON ~~LOC~~ MOZEC 3.5X15 (BALLOONS) IMPLANT
CATH INFINITI 5FR ANG PIGTAIL (CATHETERS) ×1 IMPLANT
CATH OPTITORQUE TIG 4.0 5F (CATHETERS) ×1 IMPLANT
CATH VISTA GUIDE 6FR JR4 (CATHETERS) ×1 IMPLANT
DEVICE RAD COMP TR BAND LRG (VASCULAR PRODUCTS) ×1 IMPLANT
GLIDESHEATH SLEND A-KIT 6F 22G (SHEATH) ×1 IMPLANT
GUIDEWIRE INQWIRE 1.5J.035X260 (WIRE) IMPLANT
INQWIRE 1.5J .035X260CM (WIRE) ×2
KIT ENCORE 26 ADVANTAGE (KITS) ×1 IMPLANT
KIT HEART LEFT (KITS) ×2 IMPLANT
PACK CARDIAC CATHETERIZATION (CUSTOM PROCEDURE TRAY) ×2 IMPLANT
STENT SYNERGY DES 2.75X12 (Permanent Stent) ×1 IMPLANT
STENT SYNERGY DES 3.5X12 (Permanent Stent) ×1 IMPLANT
STENT SYNERGY DES 3X38 (Permanent Stent) ×1 IMPLANT
TRANSDUCER W/STOPCOCK (MISCELLANEOUS) ×2 IMPLANT
TUBING CIL FLEX 10 FLL-RA (TUBING) ×2 IMPLANT
WIRE RUNTHROUGH .014X180CM (WIRE) ×1 IMPLANT

## 2016-02-19 NOTE — Progress Notes (Signed)
ANTICOAGULATION CONSULT NOTE   Pharmacy Consult for Heparin Indication: chest pain/ACS  No Known Allergies  Patient Measurements: Height: 5\' 4"  (162.6 cm) Weight: 155 lb (70.3 kg) IBW/kg (Calculated) : 59.2 Heparin Dosing Weight: 70.3 kg  Vital Signs: Temp: 98.2 F (36.8 C) (01/05 0105) Temp Source: Oral (01/05 0105) BP: 111/69 (01/05 1100) Pulse Rate: 64 (01/05 1100)  Labs:  Recent Labs  02/18/16 2151 02/19/16 0238 02/19/16 1025  HGB 13.4 12.6*  --   HCT 38.9* 36.2*  --   PLT 223 198  --   HEPARINUNFRC  --   --  0.23*  CREATININE 1.09 0.94  --     Estimated Creatinine Clearance: 73.5 mL/min (by C-G formula based on SCr of 0.94 mg/dL).   Medical History: Past Medical History:  Diagnosis Date  . Arthritis    "shoulders" (01/22/2015)  . Chronic lower back pain   . Chronic systolic (congestive) heart failure    a. Dx 2006; b. 01/2015 EF 45-50% by LV gram.  . Coronary artery disease    a. 2000 MI/stent to RCA & CFX;  b. 04/2005 PCI: pRCA stent 95% ISR (Cypher DES), L-R collats, EF 55%;  c. 04/[facility] [place]: PCI of an occluded circumflex (2.5 x 28 mm DES). RCA 50-60% ISR, LAD 40-50%; d. 01/2015 Cath/PCI: LM nl, LAD 40p, D1 80, LCX 3928m (3.0x12 Synergy DES), 95d (2.5x12 Syndergy DES), RCA 65p/m ISR, 60/60d, EF 45-50%.  . DDD (degenerative disc disease)   . GERD (gastroesophageal reflux disease)   . History of blood transfusion 1978   "when I had Harrington rod placement"  . Hypercholesterolemia with hypertriglyceridemia   . Hypertensive heart disease   . Obesity   . Sciatic leg pain    "left side"  . Scoliosis   . Sleep apnea     Medications:  No current facility-administered medications on file prior to encounter.    Current Outpatient Prescriptions on File Prior to Encounter  Medication Sig Dispense Refill  . aspirin 81 MG chewable tablet Chew 1 tablet (81 mg total) by mouth daily.    . chlorthalidone (HYGROTON) 25 MG  tablet TAKE 1/2 TABLET (12.5 MG TOTAL) BY MOUTH DAILY. 90 tablet 2  . clopidogrel (PLAVIX) 75 MG tablet TAKE 1 TABLET BY MOUTH EVERY DAY 90 tablet 1  . enalapril (VASOTEC) 20 MG tablet Take 1 tablet (20 mg total) by mouth 2 (two) times daily. 180 tablet 2  . Ibuprofen-Diphenhydramine Cit (ADVIL PM PO) Take 2 tablets by mouth daily as needed (sleep).    . lansoprazole (PREVACID) 30 MG capsule Take 30 mg by mouth daily.      . methocarbamol (ROBAXIN) 500 MG tablet Take 500 mg by mouth every 8 (eight) hours as needed for muscle spasms.     . metoprolol (TOPROL-XL) 200 MG 24 hr tablet TAKE 1 TABLET BY MOUTH EVERY DAY 90 tablet 3  . nitroGLYCERIN (NITROSTAT) 0.3 MG SL tablet Place 1 tablet (0.3 mg total) under the tongue every 5 (five) minutes as needed for chest pain. 25 tablet 1  . oxyCODONE-acetaminophen (PERCOCET) 7.5-325 MG tablet Take 1 tablet by mouth every 6 (six) hours as needed for moderate pain.     . rosuvastatin (CRESTOR) 20 MG tablet TAKE 1 TABLET EVERY DAY 90 tablet 3     Assessment: 56 y.o. male with chest pain for heparin. Initial HL is subtherapeutic at 0.23 on heparin 1000 units/hr. Nurse reports no issues with infusion or bleeding.  Goal of Therapy:  Heparin level 0.3-0.7 units/ml Monitor platelets by anticoagulation protocol: Yes   Plan:  Increase heparin to 1200 units/hr 6h HL Daily HL/CBC Monitor s/sx of bleeding  Arlean Hopping. Newman Pies, PharmD, BCPS Clinical Pharmacist Pager [phone] 02/19/2016,11:11 AM

## 2016-02-19 NOTE — Progress Notes (Signed)
Pt came up from ed. Pt oriented to room.  Pt is aware that he is in a camera surveillance room. Pt denies any cp. Will cont to monitor pt.

## 2016-02-19 NOTE — Progress Notes (Signed)
Echocardiogram 2D Echocardiogram has been performed.  [name] [name] [name] [name] 02/19/2016, 11:45 AM

## 2016-02-19 NOTE — ED Provider Notes (Signed)
[facility]-EMERGENCY DEPT Provider Note   CSN: [id number] Arrival date & time: 02/18/16  2144  By signing my name below, I, Modena Jansky, attest that this documentation has been prepared under the direction and in the presence of Rolland Porter, MD . Electronically Signed: Modena Jansky, Scribe. 02/19/2016. 12:12 AM.  History   Chief Complaint Chief Complaint  Patient presents with  . Chest Pain   The history is provided by the patient. No language interpreter was used.   HPI Comments: [name] [name] is a 56 y.o. male with a PMHx of CHF who presents to the Emergency Department complaining of intermittent moderate chest pain that started a few weeks ago. He states that he had a heart catheterization in Decembers that revealed stenosis. His current pain episodes feels similar to prior episodes and is gradually worsening (exacerbation is increasing in frequency). He reports exertion exacerbates the pain and resting provides relief. He describes the pain as a tight sensation radiating to his LUE. No associated symptoms. He admits to currently being on aspirin and plavix. He denies any SOB, leg swelling, or other complaints.    Cardiologist: Dr. Eliezer Bottom PCP: Noni Saupe., MD  Past Medical History:  Diagnosis Date  . Chronic lower back pain   . Chronic systolic (congestive) heart failure    a. Dx 2006; b. 01/2015 EF 45-50% by LV gram. c. 02/2016: EF 45-50% by LV gram but 55% by echo  . Coronary artery disease    a. 2000 MI/stent to RCA & CFX;  b. 04/2005 PCI: pRCA stent 95% ISR (Cypher DES), L-R collats, EF 55%;  c. 04/2012 NSTEMI at [facility] in [place]: PCI of an occluded circumflex (2.5 x 28 mm DES). RCA 50-60% ISR, LAD 40-50%; d. 01/2015 Cath/PCI: LM nl, LAD 40p, D1 80, LCX 21m (3.0x12 Synergy DES), 95d (2.5x12 Syndergy DES), RCA 65p/m ISR, 60/60d  d. 02/2016: [place] s/p DESx3 RCA  . DDD (degenerative disc disease)   . GERD (gastroesophageal reflux disease)   . History of blood  transfusion 1978  . Hypercholesterolemia with hypertriglyceridemia   . Hypertensive heart disease   . Obesity   . Sciatic leg pain    "left side"  . Scoliosis   . Sleep apnea     Patient Active Problem List   Diagnosis Date Noted  . Coronary artery disease   . Chronic systolic congestive heart failure (HCC)   . Hypercholesterolemia with hypertriglyceridemia   . Hypertensive heart disease with heart failure (HCC)   . Unstable angina (HCC)   . Hyperlipidemia 08/28/2009  . OBESITY 08/28/2009  . Essential hypertension 08/28/2009    Past Surgical History:  Procedure Laterality Date  . BACK SURGERY    . CARDIAC CATHETERIZATION  2006  . CARDIAC CATHETERIZATION N/A 01/23/2015   Procedure: Left Heart Cath and Coronary Angiography;  Surgeon: Kathleene Hazel, MD;  Location: [facility] INVASIVE CV LAB;  Service: Cardiovascular;  Laterality: N/A;  . CARDIAC CATHETERIZATION N/A 01/23/2015   Procedure: Coronary Stent Intervention;  Surgeon: Kathleene Hazel, MD;  Location: [facility] INVASIVE CV LAB;  Service: Cardiovascular;  Laterality: N/A;  . CARDIAC CATHETERIZATION N/A 02/19/2016   Procedure: Left Heart Cath and Coronary Angiography;  Surgeon: Marykay Lex, MD;  Location: [facility] INVASIVE CV LAB;  Service: Cardiovascular;  Laterality: N/A;  . CARDIAC CATHETERIZATION N/A 02/19/2016   Procedure: Coronary Stent Intervention;  Surgeon: Marykay Lex, MD;  Location: [facility] INVASIVE CV LAB;  Service: Cardiovascular;  Laterality: N/A;  . CORONARY ANGIOPLASTY  WITH STENT PLACEMENT  ~ 2001 - 04/2012 X 4   "I've had a total of 4 placed; one is inside another" (01/22/2015)  . LUMBAR DISC SURGERY     1990's?  Marland Kitchen SPINE SURGERY  1978   "Harrington rod placement"  . TONSILLECTOMY         Home Medications    Prior to Admission medications   Medication Sig Start Date End Date Taking? Authorizing Provider  aspirin 81 MG chewable tablet Chew 1 tablet (81 mg total) by mouth daily. 01/24/15  Yes Christiane Ha, MD  chlorthalidone (HYGROTON) 25 MG tablet TAKE 1/2 TABLET (12.5 MG TOTAL) BY MOUTH DAILY. 07/16/15  Yes Rollene Rotunda, MD  clopidogrel (PLAVIX) 75 MG tablet TAKE 1 TABLET BY MOUTH EVERY DAY 10/01/15  Yes Rollene Rotunda, MD  enalapril (VASOTEC) 20 MG tablet Take 1 tablet (20 mg total) by mouth 2 (two) times daily. 08/11/15  Yes Rollene Rotunda, MD  lansoprazole (PREVACID) 30 MG capsule Take 30 mg by mouth daily.     Yes Historical Provider, MD  metFORMIN (GLUCOPHAGE-XR) 500 MG 24 hr tablet Take 1,500 mg by mouth every evening.   Yes Historical Provider, MD  methocarbamol (ROBAXIN) 500 MG tablet Take 500 mg by mouth every 8 (eight) hours as needed for muscle spasms.    Yes Historical Provider, MD  metoprolol (TOPROL-XL) 200 MG 24 hr tablet TAKE 1 TABLET BY MOUTH EVERY DAY 08/06/15  Yes Rollene Rotunda, MD  nitroGLYCERIN (NITROSTAT) 0.3 MG SL tablet Place 1 tablet (0.3 mg total) under the tongue every 5 (five) minutes as needed for chest pain. 08/11/15  Yes Rollene Rotunda, MD  oxyCODONE-acetaminophen (PERCOCET) 7.5-325 MG tablet Take 1 tablet by mouth every 6 (six) hours as needed for moderate pain.  03/21/15  Yes Historical Provider, MD  rosuvastatin (CRESTOR) 20 MG tablet TAKE 1 TABLET EVERY DAY 07/16/15  Yes Rollene Rotunda, MD    Family History Family History  Problem Relation Age of Onset  . Heart disease Father   . Sleep apnea Father   . Atrial fibrillation Father   . Alcohol abuse Father     Social History Social History  Substance Use Topics  . Smoking status: Never Smoker  . Smokeless tobacco: Never Used  . Alcohol use Yes     Comment: 01/22/2015 "might have a few beers/year"     Allergies   Patient has no known allergies.   Review of Systems Review of Systems  Constitutional: Negative for appetite change, chills, diaphoresis, fatigue and fever.  HENT: Negative for mouth sores, sore throat and trouble swallowing.   Eyes: Negative for visual disturbance.  Respiratory:  Negative for cough, chest tightness, shortness of breath and wheezing.   Cardiovascular: Positive for chest pain. Negative for leg swelling.  Gastrointestinal: Negative for abdominal distention, abdominal pain, diarrhea, nausea and vomiting.  Endocrine: Negative for polydipsia, polyphagia and polyuria.  Genitourinary: Negative for dysuria, frequency and hematuria.  Musculoskeletal: Negative for gait problem.  Skin: Negative for color change, pallor and rash.  Neurological: Negative for dizziness, syncope, light-headedness and headaches.  Hematological: Bruises/bleeds easily.  Psychiatric/Behavioral: Negative for behavioral problems and confusion.     Physical Exam Updated Vital Signs BP 120/81   Pulse 77   Temp 98.8 F (37.1 C) (Oral)   Resp 16   SpO2 100%   Physical Exam  Constitutional: He is oriented to person, place, and time. He appears well-developed and well-nourished. No distress.  HENT:  Head: Normocephalic.  Eyes: Conjunctivae  are normal. Pupils are equal, round, and reactive to light. No scleral icterus.  Neck: Normal range of motion. Neck supple. No thyromegaly present.  Cardiovascular: Normal rate and regular rhythm.  Exam reveals no gallop and no friction rub.   No murmur heard. Pulmonary/Chest: Effort normal and breath sounds normal. No respiratory distress. He has no wheezes. He has no rales.  Abdominal: Soft. Bowel sounds are normal. He exhibits no distension. There is no tenderness. There is no rebound.  Musculoskeletal: Normal range of motion.  Neurological: He is alert and oriented to person, place, and time.  Skin: Skin is warm and dry. No rash noted.  Psychiatric: He has a normal mood and affect. His behavior is normal.  Nursing note and vitals reviewed.    ED Treatments / Results  DIAGNOSTIC STUDIES: Oxygen Saturation is 100% on RA, normal by my interpretation.    COORDINATION OF CARE: 12:16 AM- Pt advised of plan for treatment and pt  agrees.  Labs (all labs ordered are listed, but only abnormal results are displayed) Labs Reviewed  BASIC METABOLIC PANEL - Abnormal; Notable for the following:       Result Value   Glucose, Bld 123 (*)    All other components within normal limits  CBC - Abnormal; Notable for the following:    HCT 38.9 (*)    All other components within normal limits  HEPARIN LEVEL (UNFRACTIONATED) - Abnormal; Notable for the following:    Heparin Unfractionated 0.23 (*)    All other components within normal limits  BASIC METABOLIC PANEL - Abnormal; Notable for the following:    Glucose, Bld 108 (*)    All other components within normal limits  CBC - Abnormal; Notable for the following:    Hemoglobin 12.6 (*)    HCT 36.2 (*)    All other components within normal limits  GLUCOSE, CAPILLARY - Abnormal; Notable for the following:    Glucose-Capillary 101 (*)    All other components within normal limits  BASIC METABOLIC PANEL - Abnormal; Notable for the following:    Glucose, Bld 122 (*)    All other components within normal limits  GLUCOSE, CAPILLARY - Abnormal; Notable for the following:    Glucose-Capillary 140 (*)    All other components within normal limits  GLUCOSE, CAPILLARY - Abnormal; Notable for the following:    Glucose-Capillary 135 (*)    All other components within normal limits  CBG MONITORING, ED - Abnormal; Notable for the following:    Glucose-Capillary 127 (*)    All other components within normal limits  MRSA PCR SCREENING  PROTIME-INR  GLUCOSE, CAPILLARY  I-STAT TROPOININ, ED  POCT ACTIVATED CLOTTING TIME  POCT ACTIVATED CLOTTING TIME    EKG  EKG Interpretation  Date/Time:  Friday February 19 2016 02:33:55 EST Ventricular Rate:  67 PR Interval:  142 QRS Duration: 100 QT Interval:  397 QTC Calculation: 420 R Axis:   4 Text Interpretation:  Sinus rhythm Abnormal R-wave progression, early transition Confirmed by HORTON  MD, COURTNEY ([id number]) on 02/21/2016 7:44:09 AM        Radiology No results found.  Procedures Procedures (including critical care time)  Medications Ordered in ED Medications  labetalol (NORMODYNE,TRANDATE) injection 10 mg (not administered)  hydrALAZINE (APRESOLINE) injection 5 mg (not administered)  0.9% sodium chloride infusion (0 mL/kg/hr  74.7 kg Intravenous Stopped 02/20/16 0315)  aspirin chewable tablet 324 mg (324 mg Oral Given 02/19/16 0053)  heparin bolus via infusion 4,000 Units (4,000 Units  Intravenous Bolus from Bag 02/19/16 0236)  angioplasty book ( Does not apply Given 02/19/16 2030)     Initial Impression / Assessment and Plan / ED Course  I have reviewed the triage vital signs and the nursing notes.  Pertinent labs & imaging results that were available during my care of the patient were reviewed by me and considered in my medical decision making (see chart for details).  Clinical Course    Patient discussed with her allergy fellow on call. Patient has been evaluated in the emergency room planning admission. Her main symptom free after initial interventions here.   Final Clinical Impressions(s) / ED Diagnoses   Final diagnoses:  S/P coronary artery stent placement    New Prescriptions Discharge Medication List as of 02/20/2016 10:07 AM     Medical screening examination/treatment/procedure(s) were performed by non-physician practitioner and as supervising physician I was immediately available for consultation/collaboration.   EKG Interpretation  Date/Time:  Friday February 19 2016 02:33:55 EST Ventricular Rate:  67 PR Interval:  142 QRS Duration: 100 QT Interval:  397 QTC Calculation: 420 R Axis:   4 Text Interpretation:  Sinus rhythm Abnormal R-wave progression, early transition Confirmed by Wilkie AyeHORTON  MD, COURTNEY ([id number]) on 02/21/2016 7:44:09 AM          Rolland PorterMark Jinx Gilden, MD 02/27/16 1421

## 2016-02-19 NOTE — H&P (View-Only) (Signed)
Patient Name: [name] BoucheBobby Hauter Date of Encounter: 02/19/2016  [facility] Cardi[facility]  Hospital Problem List     Active Problems:   Essential hypertension   Coronary atherosclerosis   Chest pain   Unstable angina (HCC)   Chronic systolic congestive heart failure (HCC)    Subjective   No current complaints of chest pain or dyspnea.   Inpatient Medications    Scheduled Meds: . [START ON 02/20/2016] aspirin EC  81 mg Oral Daily  . chlorthalidone  25 mg Oral Daily  . clopidogrel  75 mg Oral Daily  . enalapril  20 mg Oral BID  . insulin aspart  0-15 Units Subcutaneous TID WC  . metoprolol  200 mg Oral Daily  . pantoprazole  20 mg Oral Daily  . rosuvastatin  20 mg Oral Daily   Continuous Infusions: . sodium chloride 75 mL/hr at 02/19/16 0238  . heparin 1,000 Units/hr (02/19/16 0235)   PRN Meds: acetaminophen, methocarbamol, nitroGLYCERIN, ondansetron (ZOFRAN) IV, oxyCODONE-acetaminophen   Vital Signs    Vitals:   02/19/16 0430 02/19/16 0500 02/19/16 0525 02/19/16 0742  BP: 125/74 113/81  123/83  Pulse:      Resp: 15 19  16   Temp:      TempSrc:      SpO2:   100% 100%  Weight:    155 lb (70.3 kg)  Height:    5\' 4"  (1.626 m)   No intake or output data in the 24 hours ending 02/19/16 0745 Filed Weights   02/19/16 0742  Weight: 155 lb (70.3 kg)    Physical Exam    GEN: Well nourished, well developed, in no acute distress.  HEENT: Grossly normal.  Neck: Supple, no JVD, carotid bruits, or masses. Cardiac: RRR, no murmurs, rubs, or gallops. No clubbing, cyanosis, edema.  Radials/DP/PT 2+ and equal bilaterally.  Respiratory:  Respirations regular and unlabored, clear to auscultation bilaterally. GI: Soft, nontender, nondistended, BS + x 4. MS: no deformity or atrophy. Skin: warm and dry, no rash. Neuro:  Strength and sensation are intact. Psych: AAOx3.  Normal affect.  Labs    CBC  Recent Labs  02/18/16 2151 02/19/16 0238  WBC 8.1 8.4  HGB 13.4  12.6*  HCT 38.9* 36.2*  MCV 84.7 84.0  PLT 223 198   Basic Metabolic Panel  Recent Labs  02/18/16 2151 02/19/16 0238  NA 139 141  K 3.7 3.7  CL 101 102  CO2 28 29  GLUCOSE 123* 108*  BUN 17 15  CREATININE 1.09 0.94  CALCIUM 9.1 9.1   Liver Function Tests No results for input(s): AST, ALT, ALKPHOS, BILITOT, PROT, ALBUMIN in the last 72 hours. No results for input(s): LIPASE, AMYLASE in the last 72 hours. Cardiac Enzymes No results for input(s): CKTOTAL, CKMB, CKMBINDEX, TROPONINI in the last 72 hours. BNP Invalid input(s): POCBNP D-Dimer No results for input(s): DDIMER in the last 72 hours. Hemoglobin A1C No results for input(s): HGBA1C in the last 72 hours. Fasting Lipid Panel No results for input(s): CHOL, HDL, LDLCALC, TRIG, CHOLHDL, LDLDIRECT in the last 72 hours. Thyroid Function Tests No results for input(s): TSH, T4TOTAL, T3FREE, THYROIDAB in the last 72 hours.  Invalid input(s): FREET3  Telemetry    SR - Personally Reviewed  ECG    SR - Personally Reviewed  Radiology    Dg Chest 2 View  Result Date: 02/18/2016 CLINICAL DATA:  Chest tightness for several weeks.  Worse today. EXAM: CHEST  2 VIEW COMPARISON:  01/22/2015 FINDINGS:  The heart size and mediastinal contours are within normal limits. Both lungs are clear. The visualized skeletal structures are unremarkable. Thoracolumbar Harrington rod incidentally noted. Moderate thoracolumbar scoliosis. IMPRESSION: No active cardiopulmonary disease. Electronically Signed   By: Ellery Plunk M.D.   On: 02/18/2016 22:16    Cardiac Studies   TTE: pending  Patient Profile     56 y.o. male with past medical history of coronary artery disease status post multiple stents with some residual disease, essential hypertension, and metabolic syndrome is here for chest pain.  Assessment & Plan    # Chest pain Patient with recurrent chest pain that is now occurring with minimal exertion, highly concerning for  unstable angina.  His ECG is without any signs of myocardial ischemia/infarction and troponin level is normal.  Currently chest pain free.  Patient has received aspirin in the emergency department. - Currently on heparin drip. - Continue home aspirin and Plavix. - With his reports of ongoing exertion angina that seems to be getting worse over the past couple of weeks, will plan for LHC today. States he can barely walk 20 ft before he develops tightness, and symptoms are taking longer to resolve.  -- The patient understands that risks included but are not limited to stroke (1 in 1000), death (1 in 1000), kidney failure [usually temporary] (1 in 500), bleeding (1 in 200), allergic reaction [possibly serious] (1 in 200).     # Coronary artery disease Patient is with known multivessel disease status post multiple stents. He is currently here with unstable angina.  He is on guideline directed medical therapy at home.   - Continue home medications. - Management as above.  # Essential hypertension Known history of essential hypertension.  Blood pressure at this time is controlled.  He does have some mild systolic heart failure. - Continue home medications.  # Chronic low back pain Secondary to multiple spinal surgeries.   - Continue home pain medications.   # Prophylaxis - Heparin drip.    Signed, Laverda Page, NP  02/19/2016, 7:45 AM   The patient has been seen in conjunction with Laverda Page, NP-C. All aspects of care have been considered and discussed. The patient has been personally interviewed, examined, and all clinical data has been reviewed.   Class 3 angina in diabetic with known severe 3 vessel CAD and multiple prior coronary stents.   Perhaps eventual best treatment option will be CABG. Will see what anatomy dictates later today.  NPO for cath and possible PCI later today. The patient was counseled to undergo left heart catheterization, coronary angiography, and  possible percutaneous coronary intervention with stent implantation. The procedural risks and benefits were discussed in detail. The risks discussed included death, stroke, myocardial infarction, life-threatening bleeding, limb ischemia, kidney injury, allergy, and possible emergency cardiac surgery. The risk of these significant complications were estimated to occur less than 1% of the time. After discussion, the patient has agreed to proceed.

## 2016-02-19 NOTE — ED Notes (Signed)
Obtained pt's informed consent for cardiac cath

## 2016-02-19 NOTE — Progress Notes (Signed)
ANTICOAGULATION CONSULT NOTE - Initial Consult  Pharmacy Consult for Heparin Indication: chest pain/ACS  No Known Allergies  Patient Measurements:   Heparin Dosing Weight: 75 kg  Vital Signs: Temp: 98.2 F (36.8 C) (01/05 0105) Temp Source: Oral (01/05 0105) BP: 117/74 (01/05 0105) Pulse Rate: 81 (01/05 0105)  Labs:  Recent Labs  02/18/16 2151  HGB 13.4  HCT 38.9*  PLT 223  CREATININE 1.09    CrCl cannot be calculated (Unknown ideal weight.).   Medical History: Past Medical History:  Diagnosis Date  . Arthritis    "shoulders" (01/22/2015)  . Chronic lower back pain   . Chronic systolic (congestive) heart failure    a. Dx 2006; b. 01/2015 EF 45-50% by LV gram.  . Coronary artery disease    a. 2000 MI/stent to RCA & CFX;  b. 04/2005 PCI: pRCA stent 95% ISR (Cypher DES), L-R collats, EF 55%;  c. [facility] [place]: PCI of an occluded circumflex (2.5 x 28 mm DES). RCA 50-60% ISR, LAD 40-50%; d. 01/2015 Cath/PCI: LM nl, LAD 40p, D1 80, LCX 6066m (3.0x12 Synergy DES), 95d (2.5x12 Syndergy DES), RCA 65p/m ISR, 60/60d, EF 45-50%.  . DDD (degenerative disc disease)   . GERD (gastroesophageal reflux disease)   . History of blood transfusion 1978   "when I had Harrington rod placement"  . Hypercholesterolemia with hypertriglyceridemia   . Hypertensive heart disease   . Obesity   . Sciatic leg pain    "left side"  . Scoliosis   . Sleep apnea     Medications:  No current facility-administered medications on file prior to encounter.    Current Outpatient Prescriptions on File Prior to Encounter  Medication Sig Dispense Refill  . aspirin 81 MG chewable tablet Chew 1 tablet (81 mg total) by mouth daily.    . chlorthalidone (HYGROTON) 25 MG tablet TAKE 1/2 TABLET (12.5 MG TOTAL) BY MOUTH DAILY. 90 tablet 2  . clopidogrel (PLAVIX) 75 MG tablet TAKE 1 TABLET BY MOUTH EVERY DAY 90 tablet 1  . enalapril (VASOTEC) 20 MG tablet Take 1 tablet  (20 mg total) by mouth 2 (two) times daily. 180 tablet 2  . Ibuprofen-Diphenhydramine Cit (ADVIL PM PO) Take 2 tablets by mouth daily as needed (sleep).    . lansoprazole (PREVACID) 30 MG capsule Take 30 mg by mouth daily.      . methocarbamol (ROBAXIN) 500 MG tablet Take 500 mg by mouth every 8 (eight) hours as needed for muscle spasms.     . metoprolol (TOPROL-XL) 200 MG 24 hr tablet TAKE 1 TABLET BY MOUTH EVERY DAY 90 tablet 3  . nitroGLYCERIN (NITROSTAT) 0.3 MG SL tablet Place 1 tablet (0.3 mg total) under the tongue every 5 (five) minutes as needed for chest pain. 25 tablet 1  . oxyCODONE-acetaminophen (PERCOCET) 7.5-325 MG tablet Take 1 tablet by mouth every 6 (six) hours as needed for moderate pain.     . rosuvastatin (CRESTOR) 20 MG tablet TAKE 1 TABLET EVERY DAY 90 tablet 3     Assessment: 56 y.o. male with chest pain for heparin   Goal of Therapy:  Heparin level 0.3-0.7 units/ml Monitor platelets by anticoagulation protocol: Yes   Plan:  Heparin 4000 units IV bolus, then start heparin 1000 units/hr Check heparin level in 6 hours.   Eddie Candlebbott, Edit Ricciardelli Vernon 02/19/2016,2:06 AM

## 2016-02-19 NOTE — ED Notes (Signed)
Echo is at bedside

## 2016-02-19 NOTE — ED Notes (Signed)
Attempted report x1. 

## 2016-02-19 NOTE — H&P (Signed)
History & Physical    Patient ID: [name] [name] MRN: [id number], DOB/AGE: 57-Aug-1962   Admit date: 02/18/2016   Primary Physician: Noni SaupeEDDING II,JOHN F., MD Primary Cardiologist: Rollene RotundaJames Hochrein, MD   History of Present Illness    [name] [name] is a 56 y.o. male with past medical history of coronary artery disease status post multiple stents, essential hypertension, and metabolic syndrome is here for chest pain.  [name] [name] states for the past 2 weeks, he has been having intermittent chest pain that he feels has been getting worse.  The pain is located in the center of his chest and goes down his left arm, usually lasting around 5-10 minutes, 5/10 in intensity, only brought on when he exerts himself, and relieved when he rests.  He states that he now has chest pain when he walks down the hall way in his home.  The are no other symptoms such as shortness of breath, nausea, lightheadedness, sweating, fainting, or feeling as if he is going to faint.  At this time he is without chest pain.  Patient does feel that his chest pain is similar to his chest pain when he needed stents.  He did not try nitroglycerin at home to help relieve his pain because the pain usually went away on its own.    Past Medical History    Past Medical History:  Diagnosis Date  . Arthritis    "shoulders" (01/22/2015)  . Chronic lower back pain   . Chronic systolic (congestive) heart failure    a. Dx 2006; b. 01/2015 EF 45-50% by LV gram.  . Coronary artery disease    a. 2000 MI/stent to RCA & CFX;  b. 04/2005 PCI: pRCA stent 95% ISR (Cypher DES), L-R collats, EF 55%;  c. 04/2012 [facility] in [place]: PCI of an occluded circumflex (2.5 x 28 mm DES). RCA 50-60% ISR, LAD 40-50%; d. 01/2015 Cath/PCI: LM nl, LAD 40p, D1 80, LCX 8480m (3.0x12 Synergy DES), 95d (2.5x12 Syndergy DES), RCA 65p/m ISR, 60/60d, EF 45-50%.  . DDD (degenerative disc disease)   . GERD (gastroesophageal reflux disease)   .  History of blood transfusion 1978   "when I had Harrington rod placement"  . Hypercholesterolemia with hypertriglyceridemia   . Hypertensive heart disease   . Obesity   . Sciatic leg pain    "left side"  . Scoliosis   . Sleep apnea     Past Surgical History:  Procedure Laterality Date  . BACK SURGERY    . CARDIAC CATHETERIZATION  2006  . CARDIAC CATHETERIZATION N/A 01/23/2015   Procedure: Left Heart Cath and Coronary Angiography;  Surgeon: Kathleene Hazelhristopher D McAlhany, MD;  [facility];  Service: Cardiovascular;  Laterality: N/A;  . CARDIAC CATHETERIZATION N/A 01/23/2015   Procedure: Coronary Stent Intervention;  Surgeon: Kathleene Hazelhristopher D McAlhany, MD;  Location: [facility] INVASIVE CV LAB;  Service: Cardiovascular;  Laterality: N/A;  . CORONARY ANGIOPLASTY WITH STENT PLACEMENT  ~ 2001 - 04/2012 X 4   "I've had a total of 4 placed; one is inside another" (01/22/2015)  . LUMBAR DISC SURGERY     1990's?  Marland Kitchen. SPINE SURGERY  1978   "Harrington rod placement"  . TONSILLECTOMY       Allergies  No Known Allergies   Home Medications    Prior to Admission medications   Medication Sig Start Date End Date Taking? Authorizing Provider  aspirin 81 MG chewable tablet Chew 1 tablet (81 mg total)  by mouth daily. 01/24/15  Yes Christiane Ha, MD  chlorthalidone (HYGROTON) 25 MG tablet TAKE 1/2 TABLET (12.5 MG TOTAL) BY MOUTH DAILY. 07/16/15  Yes Rollene Rotunda, MD  clopidogrel (PLAVIX) 75 MG tablet TAKE 1 TABLET BY MOUTH EVERY DAY 10/01/15  Yes Rollene Rotunda, MD  enalapril (VASOTEC) 20 MG tablet Take 1 tablet (20 mg total) by mouth 2 (two) times daily. 08/11/15  Yes Rollene Rotunda, MD  Ibuprofen-Diphenhydramine Cit (ADVIL PM PO) Take 2 tablets by mouth daily as needed (sleep).   Yes Historical Provider, MD  lansoprazole (PREVACID) 30 MG capsule Take 30 mg by mouth daily.     Yes Historical Provider, MD  metFORMIN (GLUCOPHAGE-XR) 500 MG 24 hr tablet Take 1,500 mg by mouth every evening.   Yes  Historical Provider, MD  methocarbamol (ROBAXIN) 500 MG tablet Take 500 mg by mouth every 8 (eight) hours as needed for muscle spasms.    Yes Historical Provider, MD  metoprolol (TOPROL-XL) 200 MG 24 hr tablet TAKE 1 TABLET BY MOUTH EVERY DAY 08/06/15  Yes Rollene Rotunda, MD  nitroGLYCERIN (NITROSTAT) 0.3 MG SL tablet Place 1 tablet (0.3 mg total) under the tongue every 5 (five) minutes as needed for chest pain. 08/11/15  Yes Rollene Rotunda, MD  oxyCODONE-acetaminophen (PERCOCET) 7.5-325 MG tablet Take 1 tablet by mouth every 6 (six) hours as needed for moderate pain.  03/21/15  Yes Historical Provider, MD  rosuvastatin (CRESTOR) 20 MG tablet TAKE 1 TABLET EVERY DAY 07/16/15  Yes Rollene Rotunda, MD    Family History    Family History  Problem Relation Age of Onset  . Heart disease Father   . Sleep apnea Father   . Atrial fibrillation Father   . Alcohol abuse Father     Social History    Social History   Social History  . Marital status: Married    Spouse name: N/A  . Number of children: N/A  . Years of education: N/A   Occupational History  . Not on file.   Social History Main Topics  . Smoking status: Never Smoker  . Smokeless tobacco: Never Used  . Alcohol use Yes     Comment: 01/22/2015 "might have a few beers/year"  . Drug use: No  . Sexual activity: Yes   Other Topics Concern  . Not on file   Social History Narrative  . No narrative on file     Review of Systems   All other systems reviewed and are otherwise negative except as noted above.  Physical Exam    Blood pressure 117/74, pulse 81, temperature 98.2 F (36.8 C), temperature source Oral, resp. rate 16, SpO2 98 %.  General: Well developed, well nourished,male in no acute distress. Head: Normocephalic, atraumatic, sclera non-icteric, no xanthomas, nares are without discharge. Dentition:  Neck: No carotid bruits. JVD not elevated.  Lungs: Respirations regular and unlabored, without wheezes or rales.  Heart:  Regular rate and rhythm. S3 noted.  No murmur, no rubs, or gallops appreciated. Abdomen: Soft, non-tender, non-distended with normoactive bowel sounds. No hepatomegaly. No rebound/guarding. No obvious abdominal masses. Msk:  Strength and tone appear normal for age. No joint deformities or effusions. Extremities: No clubbing or cyanosis. No edema.  Distal pedal pulses are 2+ bilaterally. Neuro: Alert and oriented X 3. Moves all extremities spontaneously. No focal deficits noted. Psych:  Responds to questions appropriately with a normal affect. Skin: No rashes or lesions noted  Labs    Troponin (Point of Care Test)  Recent  Labs  02/18/16 2159  TROPIPOC 0.01   No results for input(s): CKTOTAL, CKMB, TROPONINI in the last 72 hours. Lab Results  Component Value Date   WBC 8.1 02/18/2016   HGB 13.4 02/18/2016   HCT 38.9 (L) 02/18/2016   MCV 84.7 02/18/2016   PLT 223 02/18/2016    Recent Labs Lab 02/18/16 2151  NA 139  K 3.7  CL 101  CO2 28  BUN 17  CREATININE 1.09  CALCIUM 9.1  GLUCOSE 123*   Lab Results  Component Value Date   CHOL 162 01/23/2015   HDL 24 (L) 01/23/2015   LDLCALC UNABLE TO CALCULATE IF TRIGLYCERIDE OVER 400 mg/dL 16/11/9602   TRIG 540 (H) 01/23/2015   No results found for: DDIMER   B Natriuretic Peptide  Date/Time Value Ref Range Status  01/23/2015 03:22 AM 33.6 0.0 - 100.0 pg/mL Final   No results found for: PROBNP No results for input(s): INR in the last 72 hours.    Radiology Studies    Dg Chest 2 View  Result Date: 02/18/2016 CLINICAL DATA:  Chest tightness for several weeks.  Worse today. EXAM: CHEST  2 VIEW COMPARISON:  01/22/2015 FINDINGS: The heart size and mediastinal contours are within normal limits. Both lungs are clear. The visualized skeletal structures are unremarkable. Thoracolumbar Harrington rod incidentally noted. Moderate thoracolumbar scoliosis. IMPRESSION: No active cardiopulmonary disease. Electronically Signed   By: Ellery Plunk M.D.   On: 02/18/2016 22:16    EKG & Cardiac Imaging    EKG 02/17/13 :  NSR, possible pre-cordial lead displacement   ECHOCARDIOGRAM:  Assessment & Plan    Active Problems:   Essential hypertension   Coronary atherosclerosis   Chest pain   Chronic systolic congestive heart failure (HCC)  # Chest pain Patient with recurrent chest pain that is now occurring with minimal exertion, highly concerning for unstable angina.  His ECG is without any signs of myocardial ischemia/infarction and troponin level is normal.  He is currently chest pain free.  Patient has received aspirin in the emergency department. - Start heparin drip. - Continue home aspirin and Plavix. - Likely consider left heart catheterization later today.    # Coronary artery disease Patient is with known multivessel disease status post multiple stents. He is currently here with unstable angina.  He is on guideline directed medical therapy at home.   - Continue home medications. - Management as above.  # Essential hypertension Known history of essential hypertension.  Blood pressure at this time is controlled.  He does have some mild systolic heart failure. - Continue home medications.  # Chronic low back pain Secondary to multiple spinal surgeries.   - Continue home pain medications.   # Prophylaxis - Heparin drip.    Signed, Judie Grieve, MD 02/19/2016, 2:02 AM

## 2016-02-19 NOTE — Interval H&P Note (Signed)
History and Physical Interval Note:  02/19/2016 2:47 PM  [name] [name]  has presented today for surgery, with the diagnosis of cp- Unstable Angina.   The various methods of treatment have been discussed with the patient and family. After consideration of risks, benefits and other options for treatment, the patient has consented to  Procedure(s): Left Heart Cath and Coronary Angiography (N/A) with possible Percutaneous Coronary Intervention as a surgical intervention .  The patient's history has been reviewed, patient examined, no change in status, stable for surgery.  I have reviewed the patient's chart and labs.  Questions were answered to the patient's satisfaction.    Cath Lab Visit (complete for each Cath Lab visit)  Clinical Evaluation Leading to the Procedure:   ACS: Yes.   - Unstable Angina  Non-ACS:    Anginal Classification: CCS IV  Anti-ischemic medical therapy: Minimal Therapy (1 class of medications)  Non-Invasive Test Results: No non-invasive testing performed  Prior CABG: No previous CABG    [name] Lemmaavid Jesiel Garate

## 2016-02-19 NOTE — Care Management Note (Signed)
Case Management Note  Patient Details  Name: [name] [name] MRN: [id number] Date of Birth: 1960-06-21  Subjective/Objective:                  From home with spouse. /55 y.o. male with a PMHx of CHF who presents to the Emergency Department complaining of intermittent moderate chest pain that started a few weeks ago.  Action/Plan: Follow for disposition needs. /Admit to INPATIENT; anticipate discharge HOME WITH HOME HEALTH.    Expected Discharge Date:  02/22/16               Expected Discharge Plan:  Home w Home Health Services  In-House Referral:  NA  Discharge planning Services  CM Consult  Post Acute Care Choice:  NA Choice offered to:  NA  DME Arranged:  N/A DME Agency:  NA  HH Arranged:  NA HH Agency:  NA  Status of Service:  In process, will continue to follow  If discussed at Long Length of Stay Meetings, dates discussed:    Additional Comments:  Oletta CohnWood, Jaima Janney, RN 02/19/2016, 9:23 AM

## 2016-02-19 NOTE — Progress Notes (Addendum)
Patient Name: [name] [name] Date of Encounter: 02/19/2016  Primary Cardiologist: [facility]. [facility]  Hospital Problem List     Active Problems:   Essential hypertension   Coronary atherosclerosis   Chest pain   Unstable angina (HCC)   Chronic systolic congestive heart failure (HCC)    Subjective   No current complaints of chest pain or dyspnea.   Inpatient Medications    Scheduled Meds: . [START ON 02/20/2016] aspirin EC  81 mg Oral Daily  . chlorthalidone  25 mg Oral Daily  . clopidogrel  75 mg Oral Daily  . enalapril  20 mg Oral BID  . insulin aspart  0-15 Units Subcutaneous TID WC  . metoprolol  200 mg Oral Daily  . pantoprazole  20 mg Oral Daily  . rosuvastatin  20 mg Oral Daily   Continuous Infusions: . sodium chloride 75 mL/hr at 02/19/16 0238  . heparin 1,000 Units/hr (02/19/16 0235)   PRN Meds: acetaminophen, methocarbamol, nitroGLYCERIN, ondansetron (ZOFRAN) IV, oxyCODONE-acetaminophen   Vital Signs    Vitals:   02/19/16 0430 02/19/16 0500 02/19/16 0525 02/19/16 0742  BP: 125/74 113/81  123/83  Pulse:      Resp: 15 19  16   Temp:      TempSrc:      SpO2:   100% 100%  Weight:    155 lb (70.3 kg)  Height:    5\' 4"  (1.626 m)   No intake or output data in the 24 hours ending 02/19/16 0745 Filed Weights   02/19/16 0742  Weight: 155 lb (70.3 kg)    Physical Exam    GEN: Well nourished, well developed, in no acute distress.  HEENT: Grossly normal.  Neck: Supple, no JVD, carotid bruits, or masses. Cardiac: RRR, no murmurs, rubs, or gallops. No clubbing, cyanosis, edema.  Radials/DP/PT 2+ and equal bilaterally.  Respiratory:  Respirations regular and unlabored, clear to auscultation bilaterally. GI: Soft, nontender, nondistended, BS + x 4. MS: no deformity or atrophy. Skin: warm and dry, no rash. Neuro:  Strength and sensation are intact. Psych: AAOx3.  Normal affect.  Labs    CBC  Recent Labs  02/18/16 2151 02/19/16 0238  WBC 8.1 8.4  HGB 13.4  12.6*  HCT 38.9* 36.2*  MCV 84.7 84.0  PLT 223 198   Basic Metabolic Panel  Recent Labs  02/18/16 2151 02/19/16 0238  NA 139 141  K 3.7 3.7  CL 101 102  CO2 28 29  GLUCOSE 123* 108*  BUN 17 15  CREATININE 1.09 0.94  CALCIUM 9.1 9.1   Liver Function Tests No results for input(s): AST, ALT, ALKPHOS, BILITOT, PROT, ALBUMIN in the last 72 hours. No results for input(s): LIPASE, AMYLASE in the last 72 hours. Cardiac Enzymes No results for input(s): CKTOTAL, CKMB, CKMBINDEX, TROPONINI in the last 72 hours. BNP Invalid input(s): POCBNP D-Dimer No results for input(s): DDIMER in the last 72 hours. Hemoglobin A1C No results for input(s): HGBA1C in the last 72 hours. Fasting Lipid Panel No results for input(s): CHOL, HDL, LDLCALC, TRIG, CHOLHDL, LDLDIRECT in the last 72 hours. Thyroid Function Tests No results for input(s): TSH, T4TOTAL, T3FREE, THYROIDAB in the last 72 hours.  Invalid input(s): FREET3  Telemetry    SR - Personally Reviewed  ECG    SR - Personally Reviewed  Radiology    Dg Chest 2 View  Result Date: 02/18/2016 CLINICAL DATA:  Chest tightness for several weeks.  Worse today. EXAM: CHEST  2 VIEW COMPARISON:  01/22/2015 FINDINGS:  The heart size and mediastinal contours are within normal limits. Both lungs are clear. The visualized skeletal structures are unremarkable. Thoracolumbar Harrington rod incidentally noted. Moderate thoracolumbar scoliosis. IMPRESSION: No active cardiopulmonary disease. Electronically Signed   By: Ellery Plunk M.D.   On: 02/18/2016 22:16    Cardiac Studies   TTE: pending  Patient Profile     56 y.o. male with past medical history of coronary artery disease status post multiple stents with some residual disease, essential hypertension, and metabolic syndrome is here for chest pain.  Assessment & Plan    # Chest pain Patient with recurrent chest pain that is now occurring with minimal exertion, highly concerning for  unstable angina.  His ECG is without any signs of myocardial ischemia/infarction and troponin level is normal.  Currently chest pain free.  Patient has received aspirin in the emergency department. - Currently on heparin drip. - Continue home aspirin and Plavix. - With his reports of ongoing exertion angina that seems to be getting worse over the past couple of weeks, will plan for LHC today. States he can barely walk 20 ft before he develops tightness, and symptoms are taking longer to resolve.  -- The patient understands that risks included but are not limited to stroke (1 in 1000), death (1 in 1000), kidney failure [usually temporary] (1 in 500), bleeding (1 in 200), allergic reaction [possibly serious] (1 in 200).     # Coronary artery disease Patient is with known multivessel disease status post multiple stents. He is currently here with unstable angina.  He is on guideline directed medical therapy at home.   - Continue home medications. - Management as above.  # Essential hypertension Known history of essential hypertension.  Blood pressure at this time is controlled.  He does have some mild systolic heart failure. - Continue home medications.  # Chronic low back pain Secondary to multiple spinal surgeries.   - Continue home pain medications.   # Prophylaxis - Heparin drip.    Signed, Laverda Page, NP  02/19/2016, 7:45 AM   The patient has been seen in conjunction with Laverda Page, NP-C. All aspects of care have been considered and discussed. The patient has been personally interviewed, examined, and all clinical data has been reviewed.   Class 3 angina in diabetic with known severe 3 vessel CAD and multiple prior coronary stents.   Perhaps eventual best treatment option will be CABG. Will see what anatomy dictates later today.  NPO for cath and possible PCI later today. The patient was counseled to undergo left heart catheterization, coronary angiography, and  possible percutaneous coronary intervention with stent implantation. The procedural risks and benefits were discussed in detail. The risks discussed included death, stroke, myocardial infarction, life-threatening bleeding, limb ischemia, kidney injury, allergy, and possible emergency cardiac surgery. The risk of these significant complications were estimated to occur less than 1% of the time. After discussion, the patient has agreed to proceed.

## 2016-02-20 ENCOUNTER — Encounter (HOSPITAL_COMMUNITY): Payer: Self-pay | Admitting: Physician Assistant

## 2016-02-20 DIAGNOSIS — I2510 Atherosclerotic heart disease of native coronary artery without angina pectoris: Secondary | ICD-10-CM | POA: Diagnosis present

## 2016-02-20 DIAGNOSIS — I5022 Chronic systolic (congestive) heart failure: Secondary | ICD-10-CM | POA: Diagnosis present

## 2016-02-20 LAB — BASIC METABOLIC PANEL
Anion gap: 9 (ref 5–15)
BUN: 11 mg/dL (ref 6–20)
CALCIUM: 9 mg/dL (ref 8.9–10.3)
CO2: 30 mmol/L (ref 22–32)
CREATININE: 0.87 mg/dL (ref 0.61–1.24)
Chloride: 101 mmol/L (ref 101–111)
GFR calc Af Amer: >60 mL/min (ref >60)
GLUCOSE: 122 mg/dL — AB (ref 65–99)
POTASSIUM: 3.7 mmol/L (ref 3.5–5.1)
SODIUM: 140 mmol/L (ref 135–145)

## 2016-02-20 LAB — GLUCOSE, CAPILLARY: GLUCOSE-CAPILLARY: 135 mg/dL — AB (ref 65–99)

## 2016-02-20 NOTE — Care Management Note (Signed)
Case Management Note  Patient Details  Name: Holley BoucheBobby Perin MRN: [id number] Date of Birth: [birth year]  Subjective/Objective:                  Chest pain Action/Plan: Discharge planning Expected Discharge Date:  02/20/16               Expected Discharge Plan:  Home/Self Care  In-House Referral:  NA  Discharge planning Services  CM Consult  Post Acute Care Choice:  NA Choice offered to:  NA  DME Arranged:  N/A DME Agency:  NA  HH Arranged:  NA HH Agency:  NA  Status of Service:  Completed, signed off  If discussed at Long Length of Stay Meetings, dates discussed:    Additional Comments: CM notes pt has an ambulatory referral for cardiac rehab. No HH services have been recc or ordered.  No other CM needs were communicated. Yves DillJeffries, Christropher Gintz Christine, RN 02/20/2016, 9:51 AM

## 2016-02-20 NOTE — Progress Notes (Signed)
CARDIAC REHAB PHASE I   PRE:  Rate/Rhythm: 79 SR  BP:  Sitting: 117/75        SaO2: 98 RA  MODE:  Ambulation: 650 ft   POST:  Rate/Rhythm: 99 SR  BP:  Sitting: 142/83         SaO2: 96 RA  Pt ambulated 600 ft on RA, independent, steady gait, tolerated well with no complaints. Completed stent education.  Reviewed risk factors, PCI book, anti-platelet therapy, stent card, activity restrictions, ntg, exercise, heart healthy diet, carb counting, portion control, and phase 2 cardiac rehab. Pt verbalized understanding, receptive to education. Pt agrees to phase 2 cardiac rehab referral, [facility]. Pt up ad lib in room, awaiting discharge.  [phone] Joylene GrapesEmily C Aleighna Wojtas, RN, BSN 02/20/2016 8:57 AM

## 2016-02-20 NOTE — Discharge Summary (Signed)
Discharge Summary    Patient ID: [name] BoucheBobby Schlitt,  MRN: [id number], DOB/AGE: 05-09-1960 56 y.o.  Admit date: 02/18/2016 Discharge date: 02/20/2016  Primary Care Provider: Noni SaupeEDDING II,JOHN F. Primary Cardiologist: Rollene RotundaJames Hochrein, MD   Discharge Diagnoses    Principal Problem:   Unstable angina [facility](HCC) Active Problems:   Hyperlipidemia   Essential hypertension   Hypertensive heart disease with heart failure (HCC)   Coronary artery disease   Chronic systolic congestive heart failure (HCC)   Allergies No Known Allergies   History of Present Illness    [name] [name] is a 56 y.o. male with past medical history of multivessel CAD s/p multiple PCIs, DMT2, ischemic CM, essential hypertension, and metabolic syndrome who presented to [facility] on 02[facility]19/15 with chest pain.  He has a history of known severe multivessel CAD. In 2000, he had MI/stent to RCA & CFX. [facility]2005 [facility]. In 04/2012, [place] and underwent PCI/DES of an occluded LCx. In 01/2015, he underwent PCI/DESx2 to LCX; EF 45-50% at that time.   He presented with intermittent exertional chest pain x2 weeks with worsening frequency and severity. He was admitted for unstable angina.   Hospital Course     Consultants: none  Unstable angina: troponin neg x3 and ECG w/ no acute ST/TW changes. He was started on a heparin gtt and referred for coronary angiography. He underwent successful PCI with DES x3 placement to RCA. 2D ECHO with normal LV function and mild MR. Continue ASA/Plavix indefinitely, statin and BB.   CAD: see above.   Hypertensive heart disease: BP well controlled on current regimen   Ischemic CM: EF 45-50% by LV gram, but 55% by echo this admission. No s/s CHF. Continue home chlorthalidone.   Chronic LBP: continue home pain meds.   DMT2: instructed to hold Metformin for 48 hours after cath. Okay to resume tomorrow night (02/21/16 PM)  The patient has had an uncomplicated hospital  course and is recovering well. The radial catheter site is stable. He has been seen by Dr. Jens Somrenshaw today and deemed ready for discharge home. All follow-up appointments have been scheduled. Discharge medications are listed below.  _____________  Discharge Vitals Blood pressure (!) 100/58, pulse 71, temperature 98.1 F (36.7 C), temperature source Oral, resp. rate 12, height 5\' 4"  (1.626 m), weight 170 lb 3.1 oz (77.2 kg), SpO2 100 %.  Filed Weights   02/19/16 0742 02/19/16 1202 02/20/16 0359  Weight: 155 lb (70.3 kg) 164 lb 9.6 oz (74.7 kg) 170 lb 3.1 oz (77.2 kg)    Labs & Radiologic Studies     CBC  Recent Labs  02/18/16 2151 02/19/16 0238  WBC 8.1 8.4  HGB 13.4 12.6*  HCT 38.9* 36.2*  MCV 84.7 84.0  PLT 223 198   Basic Metabolic Panel  Recent Labs  02/19/16 0238 02/20/16 0344  NA 141 140  K 3.7 3.7  CL 102 101  CO2 29 30  GLUCOSE 108* 122*  BUN 15 11  CREATININE 0.94 0.87  CALCIUM 9.1 9.0     Dg Chest 2 View  Result Date: 02/18/2016 CLINICAL DATA:  Chest tightness for several weeks.  Worse today. EXAM: CHEST  2 VIEW COMPARISON:  01/22/2015 FINDINGS: The heart size and mediastinal contours are within normal limits. Both lungs are clear. The visualized skeletal structures are unremarkable. Thoracolumbar Harrington rod incidentally noted. Moderate thoracolumbar scoliosis. IMPRESSION: No active cardiopulmonary disease. Electronically Signed   By: Rosey Bathaniel R Mitchell M.D.  On: 02/18/2016 22:16     Diagnostic Studies/Procedures    2D ECHO: 02/19/2016 LV EF: 55% Study Conclusions - Left ventricle: Distal septal hypokinesis The cavity size was   normal. Systolic function was normal. The estimated ejection   fraction was 55%. Wall motion was normal; there were no regional   wall motion abnormalities. Left ventricular diastolic function   parameters were normal. - Mitral valve: There was mild regurgitation. _____________  02/19/16 Coronary Stent Intervention    Left Heart Cath and Coronary Angiography  Conclusion    Mid RCA-1 lesion, 95 %stenosed - proximal stent edge. Mid RCA-2 stented segment, 75 % in-stent re- stenosed Followed by 65% distal to crux..  Dist RCA-2 lesion, 95 %stenosed - focal lesion at the takeoff of small tandem PDA.  Prox LAD lesion, 40 %stenosed. Ost 1st Diag to 1st Diag lesion, 85 %stenosed. Stable from previous catheterizations  Ost Cx to Prox Cx recently placed Synergy DES stent 3.0 mm x 12 mm overlapping prior stent Cypher stent, 0 %stenosed.  Ost 1st Mrg to 1st Mrg extensive stented segment with previous Cypher DES 2.5 mm x 28 mm overlap distally with recently placed Synergy 2.5 x 12 mm stent., 0 %stenosed.  Dist RCA-1 lesion, 40 %stenosed. In between mid RCA lesion and distal severe lesion.  The left ventricular ejection fraction is 45-50% by visual estimate.  LV end diastolic pressure is mildly elevated.  --- CULPRIT LESIONS ----  --- LESION #1 Dist RCA-2 lesion, 95 %stenosed - focal lesion at the takeoff of small tandem PDA.  A STENT SYNERGY DES [id number] drug eluting stent was successfully placed, and does not overlap previously placed stent.  Post intervention, there is a 0% residual stenosis.  --- LESIONS #2 - Mid RCA-1 lesion, 95 %stenosed - proximal stent edge. Mid RCA-2 stented segment, 75 % in-stent re- stenosed Followed by 65% distal to crux..  A 2nd STENT SYNERGY DES 3X38 drug eluting stent was successfully placed (postdilated to 3.6 m), and overlaps previously placed stented segment and extends distal. .  A STENT SYNERGY DES 3.5X12 drug-eluting stent was successfully placed covering the proximal edge of the previously placed stent overlapping the new Synergy DES into the more proximal RCA.. - Postdilated to 3.7 mm  Post intervention, there is a 0% residual stenosis.   Successful DES PCI to extensive segments in the RCA including significant in-stent restenosis segments as well as the notable  lesions in the distal RCA. Widely patent previously placed stents in the Circumflex-OM - most recently from December 2016  Plan:  Transfer to 6 Central post procedure unit for TR band removal and post PCI care.  Expected discharge tomorrow.  Continue aspirin plus Plavix indefinitely.  Continue aggressive cardiac risk factor modification.  He will follow-up with Dr. Antoine Poche     Disposition   Pt is being discharged home today in good condition.  Follow-up Plans & Appointments    Follow-up Information    Cline Crock, PA-C Follow up on 02/25/2016.   Specialties:  Cardiology, Radiology Why:  @ 9am, please arrive at least 10 minutes early. ALSO PLEASE NOTE THAT THIS IS A DIFFERENT OFFICE THAN THE [facility] OFFICE. YOU WILL CONTINUE TO FOLLOW W/ DR. Antoine Poche AT OTHER OFFICE AT FUTURE APPOINTMENTS Contact information: [address] [place] [address] [phone]          Discharge Instructions    AMB Referral to Cardiac Rehabilitation - Phase II    Complete by:  As directed  Unstable angina   Diagnosis:   Coronary Stents Other     Amb Referral to Cardiac Rehabilitation    Complete by:  As directed    Diagnosis:  Coronary Stents Comment - To [place]      Discharge Medications     Medication List    STOP taking these medications   ADVIL PM PO     TAKE these medications   aspirin 81 MG chewable tablet Chew 1 tablet (81 mg total) by mouth daily.   chlorthalidone 25 MG tablet Commonly known as:  HYGROTON TAKE 1/2 TABLET (12.5 MG TOTAL) BY MOUTH DAILY.   clopidogrel 75 MG tablet Commonly known as:  PLAVIX TAKE 1 TABLET BY MOUTH EVERY DAY   enalapril 20 MG tablet Commonly known as:  VASOTEC Take 1 tablet (20 mg total) by mouth 2 (two) times daily.   lansoprazole 30 MG capsule Commonly known as:  PREVACID Take 30 mg by mouth daily.   metFORMIN 500 MG 24 hr tablet Commonly known as:  GLUCOPHAGE-XR Take 1,500 mg by mouth  every evening.   methocarbamol 500 MG tablet Commonly known as:  ROBAXIN Take 500 mg by mouth every 8 (eight) hours as needed for muscle spasms.   metoprolol 200 MG 24 hr tablet Commonly known as:  TOPROL-XL TAKE 1 TABLET BY MOUTH EVERY DAY   nitroGLYCERIN 0.3 MG SL tablet Commonly known as:  NITROSTAT Place 1 tablet (0.3 mg total) under the tongue every 5 (five) minutes as needed for chest pain.   PERCOCET 7.5-325 MG tablet Generic drug:  oxyCODONE-acetaminophen Take 1 tablet by mouth every 6 (six) hours as needed for moderate pain.   rosuvastatin 20 MG tablet Commonly known as:  CRESTOR TAKE 1 TABLET EVERY DAY         Outstanding Labs/Studies   none  Duration of Discharge Encounter   Greater than 30 minutes including physician time.  Signed, Cline Crock PA-C 02/20/2016, 9:17 AM

## 2016-02-20 NOTE — Progress Notes (Signed)
Patient Name: [name] [name] Date of Encounter: 02/20/2016  Primary Cardiologist: Dr. [facility] Problem List     Principal Problem:   Unstable angina [facility]) Active Problems:   Hyperlipidemia   Essential hypertension   Coronary atherosclerosis of native coronary artery   Hypertensive heart disease with heart failure (HCC)   Chronic systolic congestive heart failure (HCC)    Subjective   No chest pain or dyspnea  Inpatient Medications    Scheduled Meds: . aspirin EC  81 mg Oral Daily  . chlorthalidone  12.5 mg Oral Daily  . clopidogrel  75 mg Oral Daily  . enalapril  20 mg Oral BID  . insulin aspart  0-15 Units Subcutaneous TID WC  . metoprolol  200 mg Oral q1800  . pantoprazole  20 mg Oral Daily  . rosuvastatin  20 mg Oral Daily  . sodium chloride flush  3 mL Intravenous Q12H   Continuous Infusions: . sodium chloride Stopped (02/19/16 1250)   PRN Meds: sodium chloride, acetaminophen, methocarbamol, morphine injection, nitroGLYCERIN, ondansetron (ZOFRAN) IV, oxyCODONE-acetaminophen, sodium chloride flush   Vital Signs    Vitals:   02/19/16 2000 02/19/16 2053 02/20/16 0359 02/20/16 0711  BP:  129/69  (!) 100/58  Pulse:   73 71  Resp: 16 17 18 12   Temp:   97.9 F (36.6 C) 98.1 F (36.7 C)  TempSrc:   Axillary Oral  SpO2:  98% 99% 100%  Weight:   170 lb 3.1 oz (77.2 kg)   Height:        Intake/Output Summary (Last 24 hours) at 02/20/16 0759 Last data filed at 02/20/16 0700  Gross per 24 hour  Intake           946.88 ml  Output             3600 ml  Net         -2653.12 ml   Filed Weights   02/19/16 0742 02/19/16 1202 02/20/16 0359  Weight: 155 lb (70.3 kg) 164 lb 9.6 oz (74.7 kg) 170 lb 3.1 oz (77.2 kg)    Physical Exam    GEN: Well nourished, well developed, in no acute distress.  HEENT: Grossly normal.  Neck: Supple Cardiac: RRR, radial cath site with no hematoma; no peripheral edema Respiratory:  CTA GI: Soft, nontender,  nondistended. MS: no deformity or atrophy. Skin: warm and dry, no rash. Neuro:  Strength and sensation are intact. Psych: AAOx3.  Normal affect.  Labs    CBC  Recent Labs  02/18/16 2151 02/19/16 0238  WBC 8.1 8.4  HGB 13.4 12.6*  HCT 38.9* 36.2*  MCV 84.7 84.0  PLT 223 198   Basic Metabolic Panel  Recent Labs  02/19/16 0238 02/20/16 0344  NA 141 140  K 3.7 3.7  CL 102 101  CO2 29 30  GLUCOSE 108* 122*  BUN 15 11  CREATININE 0.94 0.87  CALCIUM 9.1 9.0     Telemetry    SR - Personally Reviewed  Radiology    Dg Chest 2 View  Result Date: 02/18/2016 CLINICAL DATA:  Chest tightness for several weeks.  Worse today. EXAM: CHEST  2 VIEW COMPARISON:  01/22/2015 FINDINGS: The heart size and mediastinal contours are within normal limits. Both lungs are clear. The visualized skeletal structures are unremarkable. Thoracolumbar Harrington rod incidentally noted. Moderate thoracolumbar scoliosis. IMPRESSION: No active cardiopulmonary disease. Electronically Signed   By: Ellery Plunk M.D.   On: 02/18/2016 22:16    Patient Profile  56 y.o. male with past medical history of coronary artery disease status post multiple stents with some residual disease, essential hypertension, and metabolic syndrome with UA; s/p PCI of RCA.  Assessment & Plan    # Unstable angina Doing well s/p PCI of RCA; no recurrent symptoms; continue ASA, plavix and statin.    # Coronary artery disease As above  # Essential hypertension BP controlled; continue present meds  # Chronic low back pain Secondary to multiple spinal surgeries.   - Continue home pain medications.   DC today with TOC appt one week and FU with Dr Antoine PocheHochrein 8-12 weeks  > 30 min PA and physician time D2  Signed, Olga MillersBrian Hollis Tuller, MD  02/20/2016, 7:59 AM

## 2016-02-20 NOTE — Discharge Instructions (Signed)

## 2016-02-22 ENCOUNTER — Telehealth: Payer: Self-pay

## 2016-02-22 ENCOUNTER — Encounter (HOSPITAL_COMMUNITY): Payer: Self-pay | Admitting: Cardiology

## 2016-02-22 NOTE — Telephone Encounter (Signed)
TCM call --pt discharged 02/20/2016.  Pt has pending appointment with Cline CrockKathryn Thompson 02/25/2016 at 9:00.   Left message on machine for pt to contact the office.

## 2016-02-23 NOTE — Telephone Encounter (Signed)
Patient contacted [facility] from [facility] on 02/20/16.  Patient understands to follow up with provider Lesle ChrisKathyrn Thompson, PA-C on 02/25/16 at 9:00 am at [address], [place], [place] [address].  Patient understands discharge instructions? Yes  Patient understands medications and regiment? Yes  Patient understands to bring all medications to this visit? Yes

## 2016-02-24 NOTE — Progress Notes (Signed)
Cardiology Office Note    Date:  02/24/2016   ID:  [name] [name], DOB 04/18/1960, MRN [id number]  PCP:  Noni Saupe., MD  Cardiologist:  Rollene Rotunda, MD  CC: Post hospital follow-up  History of Present Illness:  [name] [name] is a 56 y.o. male with a history of multivessel CAD s/p multiple PCIs, DMT2, ischemic CM (EF ~10% in 2005), and essential hypertension who presents to clinic for post hospital follow-up  He has a history of known severe multivessel CAD. In 2000, he had MI/stent to RCA & CFX. In 04/2005 he underwent PCI/DES to [facility]. In 04/2012, he had NSTEMI in [place] and underwent PCI/DES of an occluded LCx. In 01/2015, he underwent PCI/DESx2 to LCX; EF 45-50% at that time.   He was recently admitted from 1/4-02/20/16 for unstable angina. He underwent coronary angiography on 02/19/16 and had successful PCI with DES 3 placement to RCA. LV gram showed EF 45-50%. 2-D echo showed normal LV function and mild MR. He was continued on aspirin and Plavix indefinitely.  Today he presents to clinic for follow-up.  He has been feeling pretty good. Has been out walking ~10-15 minutes as a time and doing well with no chest pain and SOB. 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 He hasn't worked a full day yet. He does feel a little more tired than usual but getting better. No CP or SOB. No LE edema, orthopnea or PND. No dizziness or syncope. No blood in stool or urine. No palpitations.    Past Medical History:  Diagnosis Date  . Chronic lower back pain   . Chronic systolic (congestive) heart failure    a. Dx 2006; b. 01/2015 EF 45-50% by LV gram. c. 02/2016: EF 45-50% by LV gram but 55% by echo  . Coronary artery disease    a. 2000 MI/stent to RCA & CFX;  b. 04/2005 PCI: pRCA stent 95% ISR (Cypher DES), L-R collats, EF 55%;  c. 04/2012 NSTEMI at [facility] in [place]: PCI of an occluded circumflex (2.5 x 28 mm DES). RCA 50-60% ISR, LAD 40-50%; d. 01/2015  Cath/PCI: LM nl, LAD 40p, D1 80, LCX 39m (3.0x12 Synergy DES), 95d (2.5x12 Syndergy DES), RCA 65p/m ISR, 60/60d  d. 02/2016: [place] s/p DESx3 RCA  . DDD (degenerative disc disease)   . GERD (gastroesophageal reflux disease)   . History of blood transfusion 1978  . Hypercholesterolemia with hypertriglyceridemia   . Hypertensive heart disease   . Obesity   . Sciatic leg pain    "left side"  . Scoliosis   . Sleep apnea     Past Surgical History:  Procedure Laterality Date  . BACK SURGERY    . CARDIAC CATHETERIZATION  2006  . CARDIAC CATHETERIZATION N/A 01/23/2015   Procedure: Left Heart Cath and Coronary Angiography;  Surgeon: Kathleene Hazel, MD;  Location: [facility] INVASIVE CV LAB;  Service: Cardiovascular;  Laterality: N/A;  . CARDIAC CATHETERIZATION N/A 01/23/2015   Procedure: Coronary Stent Intervention;  Surgeon: Kathleene Hazel, MD;  Location: [facility] INVASIVE CV LAB;  Service: Cardiovascular;  Laterality: N/A;  . CARDIAC CATHETERIZATION N/A 02/19/2016   Procedure: Left Heart Cath and Coronary Angiography;  Surgeon: Marykay Lex, MD;  Location[facility] INVASIVE CV LAB;  Service: Cardiovascular;  Laterality: N/A;  . CARDIAC CATHETERIZATION N/A 02/19/2016   Procedure: Coronary Stent Intervention;  Surgeon: Marykay Lex, MD;  Location: [facility] INVASIVE CV LAB;  Service: Cardiovascular;  Laterality:  N/A;  . CORONARY ANGIOPLASTY WITH STENT PLACEMENT  ~ 2001 - 04/2012 X 4   "I've had a total of 4 placed; one is inside another" (01/22/2015)  . LUMBAR DISC SURGERY     1990's?  Marland Kitchen SPINE SURGERY  1978   "Harrington rod placement"  . TONSILLECTOMY      Current Medications: Outpatient Medications Prior to Visit  Medication Sig Dispense Refill  . aspirin 81 MG chewable tablet Chew 1 tablet (81 mg total) by mouth daily.    . chlorthalidone (HYGROTON) 25 MG tablet TAKE 1/2 TABLET (12.5 MG TOTAL) BY MOUTH DAILY. 90 tablet 2  . clopidogrel (PLAVIX) 75 MG tablet TAKE 1 TABLET BY MOUTH EVERY DAY 90 tablet  1  . enalapril (VASOTEC) 20 MG tablet Take 1 tablet (20 mg total) by mouth 2 (two) times daily. 180 tablet 2  . lansoprazole (PREVACID) 30 MG capsule Take 30 mg by mouth daily.      . metFORMIN (GLUCOPHAGE-XR) 500 MG 24 hr tablet Take 1,500 mg by mouth every evening.    . methocarbamol (ROBAXIN) 500 MG tablet Take 500 mg by mouth every 8 (eight) hours as needed for muscle spasms.     . metoprolol (TOPROL-XL) 200 MG 24 hr tablet TAKE 1 TABLET BY MOUTH EVERY DAY 90 tablet 3  . nitroGLYCERIN (NITROSTAT) 0.3 MG SL tablet Place 1 tablet (0.3 mg total) under the tongue every 5 (five) minutes as needed for chest pain. 25 tablet 1  . oxyCODONE-acetaminophen (PERCOCET) 7.5-325 MG tablet Take 1 tablet by mouth every 6 (six) hours as needed for moderate pain.     . rosuvastatin (CRESTOR) 20 MG tablet TAKE 1 TABLET EVERY DAY 90 tablet 3   No facility-administered medications prior to visit.      Allergies:   Patient has no known allergies.   Social History   Social History  . Marital status: Married    Spouse name: N/A  . Number of children: N/A  . Years of education: N/A   Social History Main Topics  . Smoking status: Never Smoker  . Smokeless tobacco: Never Used  . Alcohol use Yes     Comment: 01/22/2015 "might have a few beers/year"  . Drug use: No  . Sexual activity: Yes   Other Topics Concern  . Not on file   Social History Narrative  . No narrative on file     Family History:  The patient's family history includes Alcohol abuse in his father; Atrial fibrillation in his father; Heart disease in his father; Sleep apnea in his father.      ROS:   Please see the history of present illness.    ROS All other systems reviewed and are negative.   PHYSICAL EXAM:   VS:  BP 126/82   Pulse 71   Ht 5\' 4"  (1.626 m)   Wt 164 lb (74.4 kg)   BMI 28.15 kg/m    GEN: Well nourished, well developed, in no acute distress  HEENT: normal  Neck: no JVD, carotid bruits, or masses Cardiac: RRR;  no murmurs, rubs, or gallops,no edema  Respiratory:  clear to auscultation bilaterally, normal work of breathing GI: soft, nontender, nondistended, + BS MS: no deformity or atrophy  Skin: warm and dry, no rash Neuro:  Alert and Oriented x 3, Strength and sensation are intact Psych: euthymic mood, full affect   Wt Readings from Last 3 Encounters:  02/20/16 170 lb 3.1 oz (77.2 kg)  08/11/15 175 lb 12.8  oz (79.7 kg)  05/15/15 174 lb (78.9 kg)      Studies/Labs Reviewed:   EKG:  EKG is ordered today.  The ekg ordered today demonstrates NSR HR 71, inf Q wave  Recent Labs: 02/19/2016: Hemoglobin 12.6; Platelets 198 02/20/2016: BUN 11; Creatinine, Ser 0.87; Potassium 3.7; Sodium 140   Lipid Panel    Component Value Date/Time   CHOL 162 01/23/2015 1025   TRIG 549 (H) 01/23/2015 1025   HDL 24 (L) 01/23/2015 1025   CHOLHDL 6.8 01/23/2015 1025   VLDL UNABLE TO CALCULATE IF TRIGLYCERIDE OVER 400 mg/dL 16/10/960412/10/2014 [id number]   LDLCALC UNABLE TO CALCULATE IF TRIGLYCERIDE OVER 400 mg/dL 81/19/147812/10/2014 [id number]    Additional studies/ records that were reviewed today include:  2D ECHO: 02/19/2016 LV EF: 55% Study Conclusions - Left ventricle: Distal septal hypokinesis The cavity size was normal. Systolic function was normal. The estimated ejection fraction was 55%. Wall motion was normal; there were no regional wall motion abnormalities. Left ventricular diastolic function parameters were normal. - Mitral valve: There was mild regurgitation. _____________  02/19/16 Coronary Stent Intervention  Left Heart Cath and Coronary Angiography  Conclusion    Mid RCA-1 lesion, 95 %stenosed - proximal stent edge. Mid RCA-2 stented segment, 75 % in-stent re- stenosed Followed by 65% distal to crux..  Dist RCA-2 lesion, 95 %stenosed - focal lesion at the takeoff of small tandem PDA.  Prox LAD lesion, 40 %stenosed. Ost 1st Diag to 1st Diag lesion, 85 %stenosed. Stable from previous  catheterizations  Ost Cx to Prox Cx recently placed Synergy DES stent 3.0 mm x 12 mm overlapping prior stent Cypher stent, 0 %stenosed.  Ost 1st Mrg to 1st Mrg extensive stented segment with previous Cypher DES 2.5 mm x 28 mm overlap distally with recently placed Synergy 2.5 x 12 mm stent., 0 %stenosed.  Dist RCA-1 lesion, 40 %stenosed. In between mid RCA lesion and distal severe lesion.  The left ventricular ejection fraction is 45-50% by visual estimate.  LV end diastolic pressure is mildly elevated.  --- CULPRIT LESIONS ----  --- LESION #1 Dist RCA-2 lesion, 95 %stenosed - focal lesion at the takeoff of small tandem PDA.  A STENT SYNERGY DES [id number] drug eluting stent was successfully placed, and does not overlap previously placed stent.  Post intervention, there is a 0% residual stenosis.  --- LESIONS #2 - Mid RCA-1 lesion, 95 %stenosed - proximal stent edge. Mid RCA-2 stented segment, 75 % in-stent re- stenosed Followed by 65% distal to crux..  A 2nd STENT SYNERGY DES 3X38 drug eluting stent was successfully placed (postdilated to 3.6 m), and overlaps previously placed stented segment and extends distal. .  A STENT SYNERGY DES 3.5X12 drug-eluting stent was successfully placed covering the proximal edge of the previously placed stent overlapping the new Synergy DES into the more proximal RCA.. - Postdilated to 3.7 mm  Post intervention, there is a 0% residual stenosis.  Successful DES PCI to extensive segments in the RCA including significant in-stent restenosis segments as well as the notable lesions in the distal RCA. Widely patent previously placed stents in the Circumflex-OM - most recently from December 2016  Plan:  Transfer to 6 Central post procedure unit for TR band removal and post PCI care.  Expected discharge tomorrow.  Continue aspirin plus Plavix indefinitely.  Continue aggressive cardiac risk factor modification.     ASSESSMENT & PLAN:   CAD: stable.  Continue ASA/plavix, statin and BB.   Hypertensive heart disease: BP well controlled  today . No changes.   DMT2: continue home remgien   HLD: continue statin   GERD: he takes Prevacid which has a theoretical interaction with plavix. He has been on both medications for many years and not interested in switching at this time. He will discuss more with Dr. Antoine Poche and next visit.    Medication Adjustments/Labs and Tests Ordered: Current medicines are reviewed at length with the patient today.  Concerns regarding medicines are outlined above.  Medication changes, Labs and Tests ordered today are listed in the Patient Instructions below. There are no Patient Instructions on file for this visit.   Signed, Cline Crock, PA-C  02/24/2016 8:43 AM    [facility] [address] [place], [place], [place]  [address] Phone: [phone]; Fax: [phone]

## 2016-02-25 ENCOUNTER — Ambulatory Visit (INDEPENDENT_AMBULATORY_CARE_PROVIDER_SITE_OTHER): Payer: BLUE CROSS/BLUE SHIELD | Admitting: Physician Assistant

## 2016-02-25 ENCOUNTER — Encounter: Payer: Self-pay | Admitting: Physician Assistant

## 2016-02-25 VITALS — BP 126/82 | HR 71 | Ht 64.0 in | Wt 164.0 lb

## 2016-02-25 DIAGNOSIS — E782 Mixed hyperlipidemia: Secondary | ICD-10-CM

## 2016-02-25 DIAGNOSIS — K219 Gastro-esophageal reflux disease without esophagitis: Secondary | ICD-10-CM

## 2016-02-25 DIAGNOSIS — E118 Type 2 diabetes mellitus with unspecified complications: Secondary | ICD-10-CM

## 2016-02-25 DIAGNOSIS — I2510 Atherosclerotic heart disease of native coronary artery without angina pectoris: Secondary | ICD-10-CM | POA: Diagnosis not present

## 2016-02-25 DIAGNOSIS — I10 Essential (primary) hypertension: Secondary | ICD-10-CM

## 2016-02-25 DIAGNOSIS — I200 Unstable angina: Secondary | ICD-10-CM

## 2016-02-25 DIAGNOSIS — I119 Hypertensive heart disease without heart failure: Secondary | ICD-10-CM | POA: Diagnosis not present

## 2016-02-25 DIAGNOSIS — Z09 Encounter for follow-up examination after completed treatment for conditions other than malignant neoplasm: Secondary | ICD-10-CM

## 2016-02-25 NOTE — Patient Instructions (Addendum)
Medication Instructions:  Your physician recommends that you continue on your current medications as directed. Please refer to the Current Medication list given to you today.   Labwork: None ordered  Testing/Procedures: None ordered  Follow-Up: Your physician recommends that you schedule a follow-up appointment in: 3 MONTHS WITH DR. HOCHREIN   Any Other Special Instructions Will Be Listed Below (If Applicable).     If you need a refill on your cardiac medications before your next appointment, please call your pharmacy.   

## 2016-03-09 DIAGNOSIS — H401132 Primary open-angle glaucoma, bilateral, moderate stage: Secondary | ICD-10-CM | POA: Diagnosis not present

## 2016-03-26 ENCOUNTER — Other Ambulatory Visit: Payer: Self-pay | Admitting: Cardiology

## 2016-04-19 DIAGNOSIS — M47897 Other spondylosis, lumbosacral region: Secondary | ICD-10-CM | POA: Diagnosis not present

## 2016-04-19 DIAGNOSIS — M48061 Spinal stenosis, lumbar region without neurogenic claudication: Secondary | ICD-10-CM | POA: Diagnosis not present

## 2016-04-19 DIAGNOSIS — Z6826 Body mass index (BMI) 26.0-26.9, adult: Secondary | ICD-10-CM | POA: Diagnosis not present

## 2016-04-19 DIAGNOSIS — M961 Postlaminectomy syndrome, not elsewhere classified: Secondary | ICD-10-CM | POA: Diagnosis not present

## 2016-04-19 DIAGNOSIS — Z5181 Encounter for therapeutic drug level monitoring: Secondary | ICD-10-CM | POA: Diagnosis not present

## 2016-05-19 DIAGNOSIS — E1165 Type 2 diabetes mellitus with hyperglycemia: Secondary | ICD-10-CM | POA: Diagnosis not present

## 2016-05-27 DIAGNOSIS — M79672 Pain in left foot: Secondary | ICD-10-CM | POA: Diagnosis not present

## 2016-05-27 DIAGNOSIS — Z6827 Body mass index (BMI) 27.0-27.9, adult: Secondary | ICD-10-CM | POA: Diagnosis not present

## 2016-05-27 DIAGNOSIS — E7439 Other disorders of intestinal carbohydrate absorption: Secondary | ICD-10-CM | POA: Diagnosis not present

## 2016-05-27 DIAGNOSIS — E782 Mixed hyperlipidemia: Secondary | ICD-10-CM | POA: Diagnosis not present

## 2016-06-22 NOTE — Progress Notes (Signed)
HPI The patient presents for followup after recent hospitalization for unstable angina.  He has a history of multiple coronary interventions.  He was in the hospital most recently with moderate 80% diagonal stenosis, proximal 95% circumflex stenosis before the stented area and 95% stenosis just beyond the stent area.  He had patent stents in the right coronary with 60-70% stenosis. He underwent DES stenting of his proximal circumflex and DES stenting of his mid circumflex.  He was in the hospital again in early January. He required PCI with drug-eluting stents 3 placed in the right coronary. His EF was 45% by LV gram. It was normal by echo with mild MR.    Since I last saw him he has done well. The patient denies any new symptoms such as chest discomfort, neck or arm discomfort. There has been no new shortness of breath, PND or orthopnea. There have been no reported palpitations, presyncope or syncope.  He is walking and working.   No Known Allergies  Current Outpatient Prescriptions  Medication Sig Dispense Refill  . aspirin 81 MG chewable tablet Chew 1 tablet (81 mg total) by mouth daily.    . chlorthalidone (HYGROTON) 25 MG tablet TAKE 1/2 TABLET (12.5 MG TOTAL) BY MOUTH DAILY. 90 tablet 2  . clopidogrel (PLAVIX) 75 MG tablet TAKE 1 TABLET BY MOUTH EVERY DAY 90 tablet 0  . enalapril (VASOTEC) 20 MG tablet Take 1 tablet (20 mg total) by mouth 2 (two) times daily. 180 tablet 2  . lansoprazole (PREVACID) 30 MG capsule Take 30 mg by mouth daily.      . metFORMIN (GLUCOPHAGE-XR) 500 MG 24 hr tablet Take 500 mg by mouth every evening.     . methocarbamol (ROBAXIN) 500 MG tablet Take 500 mg by mouth every 8 (eight) hours as needed for muscle spasms.     . metoprolol (TOPROL-XL) 200 MG 24 hr tablet TAKE 1 TABLET BY MOUTH EVERY DAY 90 tablet 3  . nitroGLYCERIN (NITROSTAT) 0.3 MG SL tablet Place 1 tablet (0.3 mg total) under the tongue every 5 (five) minutes as needed for chest pain. 25 tablet 1  .  oxyCODONE-acetaminophen (PERCOCET) 7.5-325 MG tablet Take 1 tablet by mouth every 6 (six) hours as needed for moderate pain.     . rosuvastatin (CRESTOR) 40 MG tablet Take 1 tablet (40 mg total) by mouth daily. 90 tablet 3   No current facility-administered medications for this visit.     Past Medical History:  Diagnosis Date  . Chronic lower back pain   . Chronic systolic (congestive) heart failure (HCC)    a. Dx 2006; b. 01/2015 EF 45-50% by LV gram. c. 02/2016: EF 45-50% by LV gram but 55% by echo  . Coronary artery disease    a. 2000 MI/stent to RCA & CFX;  b. 04/2005 PCI: pRCA stent 95% ISR (Cypher DES), L-R collats, EF 55%;  c. 04/2012 NSTEMI at [facility] in [place]: PCI of an occluded circumflex (2.5 x 28 mm DES). RCA 50-60% ISR, LAD 40-50%; d. 01/2015 Cath/PCI: LM nl, LAD 40p, D1 80, LCX 16m (3.0x12 Synergy DES), 95d (2.5x12 Syndergy DES), RCA 65p/m ISR, 60/60d  d. 02/2016: [place] s/p DESx3 RCA  . DDD (degenerative disc disease)   . GERD (gastroesophageal reflux disease)   . History of blood transfusion 1978  . Hypercholesterolemia with hypertriglyceridemia   . Hypertensive heart disease   . Obesity   . Sciatic leg pain    "left side"  .  Scoliosis   . Sleep apnea     Past Surgical History:  Procedure Laterality Date  . BACK SURGERY    . CARDIAC CATHETERIZATION  2006  . CARDIAC CATHETERIZATION N/A 01/23/2015   Procedure: Left Heart Cath and Coronary Angiography;  Surgeon: Kathleene Hazelhristopher D McAlhany, MD;  [facility];  Service: Cardiovascular;  Laterality: N/A;  . CARDIAC CATHETERIZATION N/A 01/23/2015   Procedure: Coronary Stent Intervention;  Surgeon: Kathleene Hazelhristopher D McAlhany, MD;  Location: [facility] INV[facility] CV LAB;  Service: Cardiovascular;  Laterality: N/A;  . CARDIAC CATHETERIZATION N/A 02/19/2016   Procedure: Left Heart Cath and Coronary Angiography;  Surgeon: Marykay Lexavid W Harding, MD;  [facility];  Service: Cardiovascular;  Laterality: N/A;    . CARDIAC CATHETERIZATION N/A 02/19/2016   Procedure: Coronary Stent Intervention;  Surgeon: Marykay Lexavid W Harding, MD;  [facility];  Service: Cardiovascular;  Laterality: N/A;  . CORONARY ANGIOPLASTY WITH STENT PLACEMENT  ~ 2001 - 04/2012 X 4   "I've had a total of 4 placed; one is inside another" (01/22/2015)  . LUMBAR DISC SURGERY     1990's?  Marland Kitchen. SPINE SURGERY  1978   "Harrington rod placement"  . TONSILLECTOMY      ROS:    As stated in the HPI and negative for all other systems.  PHYSICAL EXAM BP 122/74   Pulse 72   Ht 5\' 4"  (1.626 m)   Wt 167 lb (75.8 kg)   BMI 28.67 kg/m  GENERAL:  Well appearing NECK:  No jugular venous distention, waveform within normal limits, carotid upstroke brisk and symmetric, no bruits, no thyromegaly LUNGS:  Clear to auscultation bilaterally CHEST:  Unremarkable HEART:  PMI not displaced or sustained,S1 and S2 within normal limits, no S3, no S4, no clicks, no rubs, no murmurs ABD:  Flat, positive bowel sounds normal in frequency in pitch, no bruits, no rebound, no guarding, no midline pulsatile mass, no hepatomegaly, no splenomegaly EXT:  2 plus pulses throughout, no edema, no cyanosis no clubbing   EKG:  NA   ASSESSMENT AND PLAN  Coronary artery disease:     He is on Plavix and ASA indefinitely.  At this point we will continue with aggressive risk reduction.  I personally reviewed the films today in the room with the patient. He seems to have a good sense of when he is having angina and he understands to present to the hospital when this occurs.  Hypertensive heart disease:  The blood pressure is at target. No change in medications is indicated. We will continue with therapeutic lifestyle changes (TLC).  Hyperlipidemia and hypertriglyceridemia:  I would like to have him axis Crestor. Going to change to 40 mg. He should get a lipid and liver in 8 weeks and his primary provider.  Chronic systolic CHF/ICM: EF 45-50% by recent LV gram but  normal by echo in Jan.  No change in therapy is planned.

## 2016-06-23 DIAGNOSIS — M7752 Other enthesopathy of left foot: Secondary | ICD-10-CM | POA: Diagnosis not present

## 2016-06-23 DIAGNOSIS — M6702 Short Achilles tendon (acquired), left ankle: Secondary | ICD-10-CM | POA: Diagnosis not present

## 2016-06-24 ENCOUNTER — Encounter: Payer: Self-pay | Admitting: Cardiology

## 2016-06-24 ENCOUNTER — Ambulatory Visit (INDEPENDENT_AMBULATORY_CARE_PROVIDER_SITE_OTHER): Payer: BLUE CROSS/BLUE SHIELD | Admitting: Cardiology

## 2016-06-24 VITALS — BP 122/74 | HR 72 | Ht 64.0 in | Wt 167.0 lb

## 2016-06-24 DIAGNOSIS — I2510 Atherosclerotic heart disease of native coronary artery without angina pectoris: Secondary | ICD-10-CM | POA: Diagnosis not present

## 2016-06-24 DIAGNOSIS — E785 Hyperlipidemia, unspecified: Secondary | ICD-10-CM | POA: Diagnosis not present

## 2016-06-24 DIAGNOSIS — I10 Essential (primary) hypertension: Secondary | ICD-10-CM

## 2016-06-24 MED ORDER — ROSUVASTATIN CALCIUM 40 MG PO TABS: 40.0000 mg | ORAL_TABLET | Freq: Every day | ORAL | 3 refills | Status: DC

## 2016-06-24 NOTE — Patient Instructions (Signed)
Medication Instructions:  INCREASE- Crestor 40 mg daily  Labwork: Fasting Lipids liver 8 weeks by PCP  Testing/Procedures: None Ordered  Follow-Up: Your physician recommends that you schedule a follow-up appointment in: 4 Months   Any Other Special Instructions Will Be Listed Below (If Applicable).   If you need a refill on your cardiac medications before your next appointment, please call your pharmacy.

## 2016-07-03 ENCOUNTER — Other Ambulatory Visit: Payer: Self-pay | Admitting: Cardiology

## 2016-07-04 NOTE — Telephone Encounter (Signed)
Rx request sent to pharmacy.  

## 2016-07-13 ENCOUNTER — Other Ambulatory Visit: Payer: Self-pay | Admitting: Cardiology

## 2016-07-13 NOTE — Telephone Encounter (Signed)
Rx has been sent to the pharmacy electronically. ° °

## 2016-07-18 DIAGNOSIS — M48061 Spinal stenosis, lumbar region without neurogenic claudication: Secondary | ICD-10-CM | POA: Diagnosis not present

## 2016-07-18 DIAGNOSIS — M961 Postlaminectomy syndrome, not elsewhere classified: Secondary | ICD-10-CM | POA: Diagnosis not present

## 2016-07-18 DIAGNOSIS — Z6827 Body mass index (BMI) 27.0-27.9, adult: Secondary | ICD-10-CM | POA: Diagnosis not present

## 2016-07-28 DIAGNOSIS — M6702 Short Achilles tendon (acquired), left ankle: Secondary | ICD-10-CM | POA: Diagnosis not present

## 2016-07-28 DIAGNOSIS — L608 Other nail disorders: Secondary | ICD-10-CM | POA: Diagnosis not present

## 2016-07-28 DIAGNOSIS — M7752 Other enthesopathy of left foot: Secondary | ICD-10-CM | POA: Diagnosis not present

## 2016-07-28 DIAGNOSIS — E119 Type 2 diabetes mellitus without complications: Secondary | ICD-10-CM | POA: Diagnosis not present

## 2016-08-20 ENCOUNTER — Other Ambulatory Visit: Payer: Self-pay | Admitting: Cardiology

## 2016-10-14 ENCOUNTER — Other Ambulatory Visit: Payer: Self-pay | Admitting: Cardiology

## 2016-10-24 NOTE — Progress Notes (Signed)
HPI The patient presents for followup after recent hospitalization for unstable angina.  He has a history of multiple coronary interventions.  He was in the hospital most recently in Jan with moderate 80% diagonal stenosis, proximal 95% circumflex stenosis before the stented area and 95% stenosis just beyond the stent area.  He had patent stents in the right coronary with 60-70% stenosis. He underwent DES stenting of his proximal circumflex and DES stenting of his mid circumflex.  He was in the hospital again in early January. He required PCI with drug-eluting stents 3 placed in the right coronary. His EF was 45% by LV gram. It was normal by echo with mild MR.    He presents for follow up.  Since I last saw him he has done well.  The patient denies any new symptoms such as chest discomfort, neck or arm discomfort. There has been no new shortness of breath, PND or orthopnea. There have been no reported palpitations, presyncope or syncope.  He has had some foot procedures that has limited him slightly.  However, he is back to doing some walking.     No Known Allergies  Current Outpatient Prescriptions  Medication Sig Dispense Refill  . aspirin 81 MG chewable tablet Chew 1 tablet (81 mg total) by mouth daily.    . chlorthalidone (HYGROTON) 25 MG tablet TAKE 1/2 TABLET (12.5 MG TOTAL) BY MOUTH DAILY. 90 tablet 1  . clopidogrel (PLAVIX) 75 MG tablet TAKE 1 TABLET BY MOUTH EVERY DAY 90 tablet 1  . enalapril (VASOTEC) 20 MG tablet TAKE 1 TABLET BY MOUTH TWICE A DAY 180 tablet 3  . lansoprazole (PREVACID) 30 MG capsule Take 30 mg by mouth daily.      . metFORMIN (GLUCOPHAGE-XR) 500 MG 24 hr tablet Take 500 mg by mouth every evening.     . methocarbamol (ROBAXIN) 500 MG tablet Take 500 mg by mouth every 8 (eight) hours as needed for muscle spasms.     . metoprolol (TOPROL-XL) 200 MG 24 hr tablet TAKE 1 TABLET BY MOUTH EVERY DAY 90 tablet 3  . nitroGLYCERIN (NITROSTAT) 0.3 MG SL tablet Place 1 tablet  (0.3 mg total) under the tongue every 5 (five) minutes as needed for chest pain. 25 tablet 1  . oxyCODONE-acetaminophen (PERCOCET) 7.5-325 MG tablet Take 1 tablet by mouth every 6 (six) hours as needed for moderate pain.     . rosuvastatin (CRESTOR) 40 MG tablet Take 1 tablet (40 mg total) by mouth daily. 90 tablet 3   No current facility-administered medications for this visit.     Past Medical History:  Diagnosis Date  . Chronic lower back pain   . Chronic systolic (congestive) heart failure (HCC)    a. Dx 2006; b. 01/2015 EF 45-50% by LV gram. c. 02/2016: EF 45-50% by LV gram but 55% by echo  . Coronary artery disease    a. 2000 MI/stent to RCA & CFX;  b. 04/2005 PCI: pRCA stent 95% ISR (Cypher DES), L-R collats, EF 55%;  c. 04/2012 [facility] [place]: PCI of an occluded circumflex (2.5 x 28 mm DES). RCA 50-60% ISR, LAD 40-50%; d. 01/2015 Cath/PCI: LM nl, LAD 40p, D1 80, LCX 3765m (3.0x12 Synergy DES), 95d (2.5x12 Syndergy DES), RCA 65p/m ISR, 60/60d  d. 02/2016: [place] s/p DESx3 RCA  . DDD (degenerative disc disease)   . GERD (gastroesophageal reflux disease)   . History of blood transfusion 1978  . Hypercholesterolemia with hypertriglyceridemia   .  Hypertensive heart disease   . Obesity   . Sciatic leg pain    "left side"  . Scoliosis   . Sleep apnea     Past Surgical History:  Procedure Laterality Date  . BACK SURGERY    . CARDIAC CATHETERIZATION  2006  . CARDIAC CATHETERIZATION N/A 01/23/2015   Procedure: Left Heart Cath and Coronary Angiography;  Surgeon: Kathleene Hazel, MD;  [facility]VASIVE CV LAB;  Service: Cardiovascular;  Laterality: N/A;  . CARDIAC CATHETERIZATION N/A 01/23/2015   Procedure: Coronary Stent Intervention;  Surgeon: Kathleene Hazel, MD;  [facility]: [facility] INV[facility] CV LAB;  Service: Cardiovascular;  Laterality: N/A;  . CARDIAC CATHETERIZATION N/A 02/19/2016   Procedure: Left Heart Cath and Coronary Angiography;  Surgeon:  Marykay Lex, MD;  [facility] INVASIVE CV LAB;  Service: Cardiovascular;  Laterality: N/A;  . CARDIAC CATHETERIZATION N/A 02/19/2016   Procedure: Coronary Stent Intervention;  Surgeon: Marykay Lex, MD;  Location: [facility] INVASIVE CV LAB;  Service: Cardiovascular;  Laterality: N/A;  . CORONARY ANGIOPLASTY WITH STENT PLACEMENT  ~ 2001 - 04/2012 X 4   "I've had a total of 4 placed; one is inside another" (01/22/2015)  . LUMBAR DISC SURGERY     1990's?  Marland Kitchen SPINE SURGERY  1978   "Harrington rod placement"  . TONSILLECTOMY      ROS:    As stated in the HPI and negative for all other systems.  PHYSICAL EXAM BP 133/84   Pulse 82   Ht  (1.626 m)   Wt 170 lb 9.6 oz (77.4 kg)   BMI 29.28 kg/m   GENERAL:  Well appearing NECK:  No jugular venous distention, waveform within normal limits, carotid upstroke brisk and symmetric, no bruits, no thyromegaly LUNGS:  Clear to auscultation bilaterally CHEST:  Unremarkable HEART:  PMI not displaced or sustained,S1 and S2 within normal limits, no S3, no S4, no clicks, no rubs, no murmurs ABD:  Flat, positive bowel sounds normal in frequency in pitch, no bruits, no rebound, no guarding, no midline pulsatile mass, no hepatomegaly, no splenomegaly EXT:  2 plus pulses throughout, no edema, no cyanosis no clubbing   EKG:  NA   ASSESSMENT AND PLAN  Coronary artery disease:     No change in therapy or further testing.   He is on Plavix and ASA indefinitely.   Hypertensive heart disease:   The blood pressure is at target. No change in medications is indicated. We will continue with therapeutic lifestyle changes (TLC).  Hyperlipidemia and hypertriglyceridemia:  His LDL was 44 with HDL of 43.  This is much better.  No change in therapy.   Chronic systolic CHF/ICM: EF 45-50% by recent LV gram but normal by echo in Jan. No further testing is planned.

## 2016-10-25 ENCOUNTER — Encounter: Payer: Self-pay | Admitting: Cardiology

## 2016-10-25 ENCOUNTER — Ambulatory Visit (INDEPENDENT_AMBULATORY_CARE_PROVIDER_SITE_OTHER): Payer: BLUE CROSS/BLUE SHIELD | Admitting: Cardiology

## 2016-10-25 VITALS — BP 133/84 | HR 82 | Ht 64.0 in | Wt 170.6 lb

## 2016-10-25 DIAGNOSIS — I10 Essential (primary) hypertension: Secondary | ICD-10-CM | POA: Diagnosis not present

## 2016-10-25 DIAGNOSIS — I2510 Atherosclerotic heart disease of native coronary artery without angina pectoris: Secondary | ICD-10-CM

## 2016-10-25 DIAGNOSIS — E785 Hyperlipidemia, unspecified: Secondary | ICD-10-CM | POA: Diagnosis not present

## 2016-10-25 NOTE — Patient Instructions (Signed)

## 2016-10-26 ENCOUNTER — Encounter: Payer: Self-pay | Admitting: Cardiology

## 2016-10-31 DIAGNOSIS — M5416 Radiculopathy, lumbar region: Secondary | ICD-10-CM | POA: Diagnosis not present

## 2016-10-31 DIAGNOSIS — M961 Postlaminectomy syndrome, not elsewhere classified: Secondary | ICD-10-CM | POA: Diagnosis not present

## 2017-01-03 DIAGNOSIS — M5416 Radiculopathy, lumbar region: Secondary | ICD-10-CM | POA: Diagnosis not present

## 2017-01-03 DIAGNOSIS — M961 Postlaminectomy syndrome, not elsewhere classified: Secondary | ICD-10-CM | POA: Diagnosis not present

## 2017-02-10 ENCOUNTER — Other Ambulatory Visit: Payer: Self-pay | Admitting: Cardiology

## 2017-02-18 IMAGING — CR DG CHEST 2V
2 series · 2 of 2 positions shown · non-contrast
Comparison: 04/22/2005.

CLINICAL DATA: Chest tightness and pain for several weeks, worse
today.

EXAM:
CHEST  2 VIEW

[chest pa]
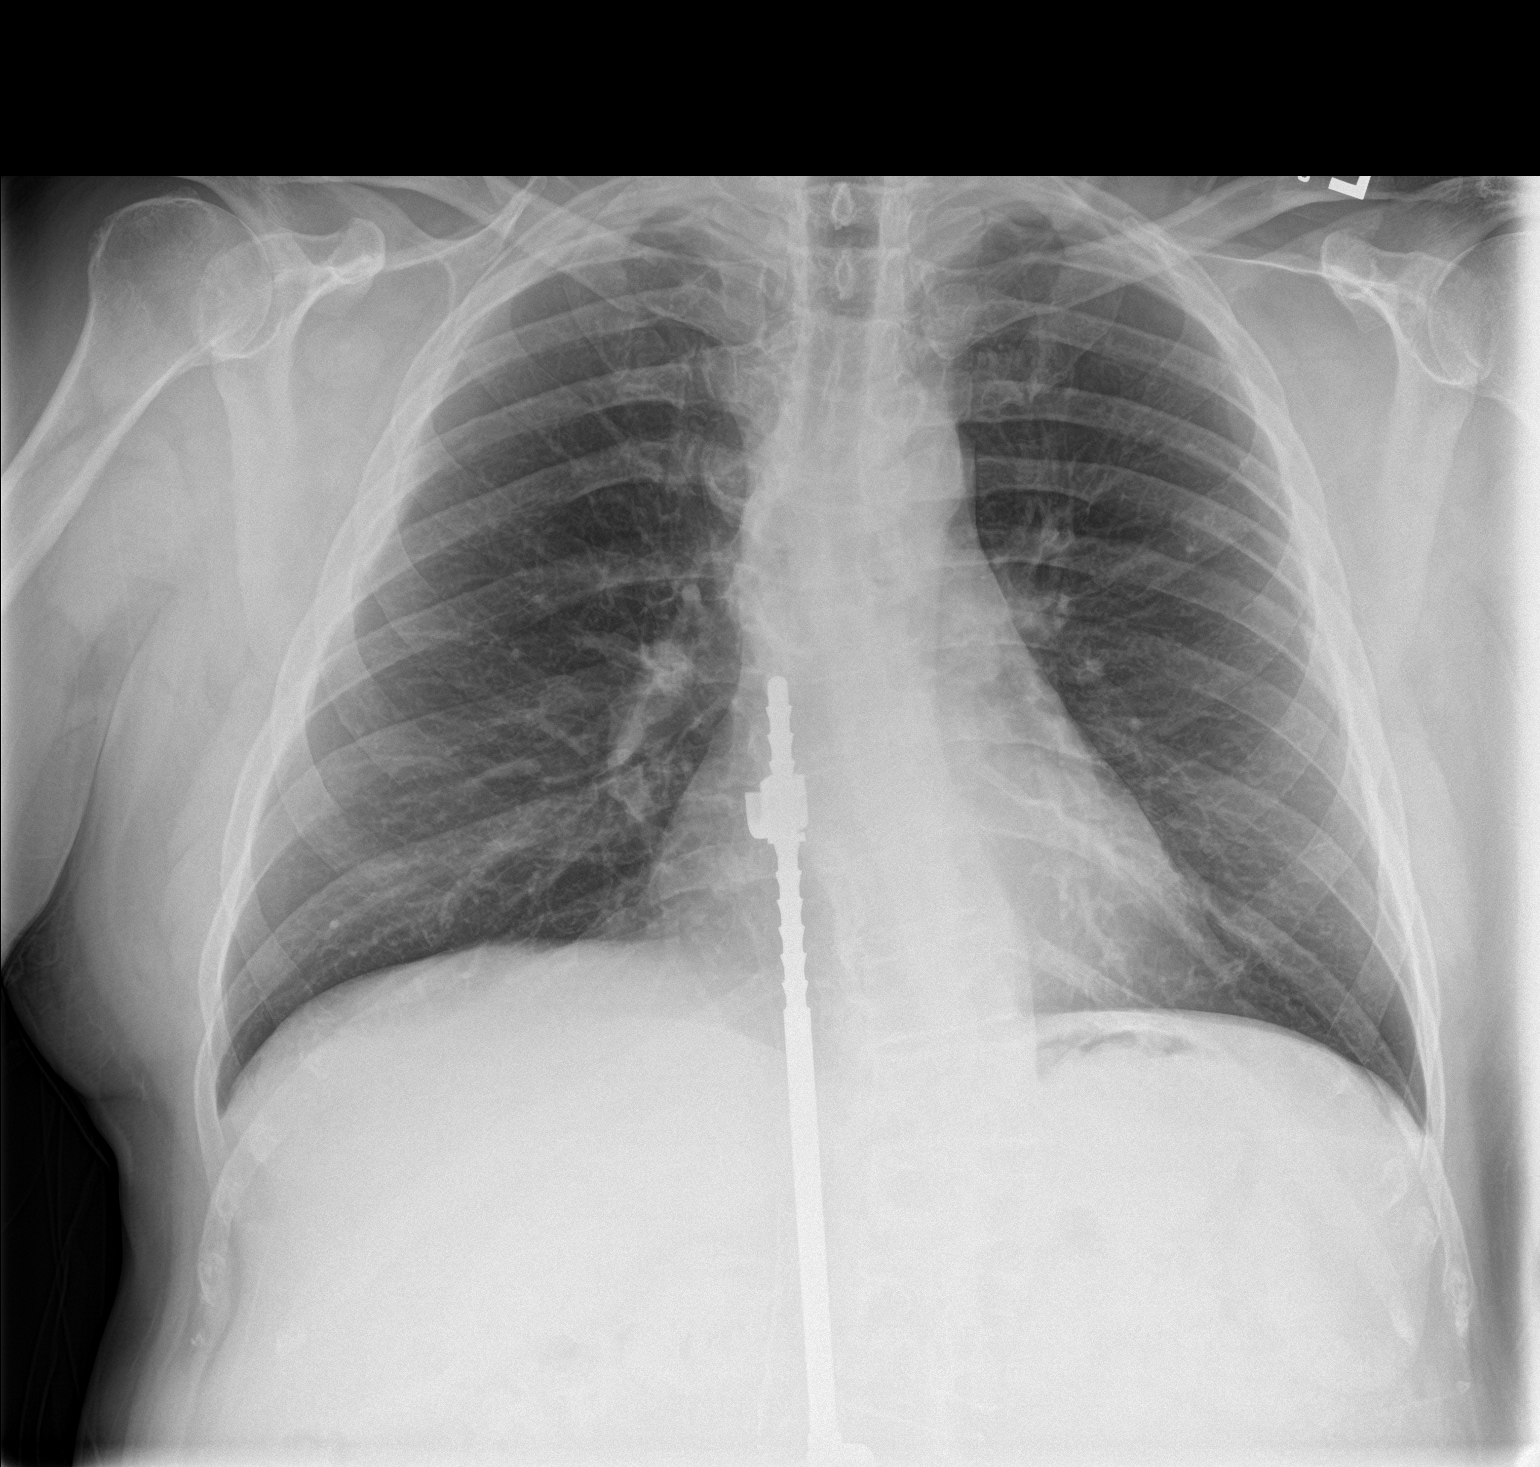

[chest lat]
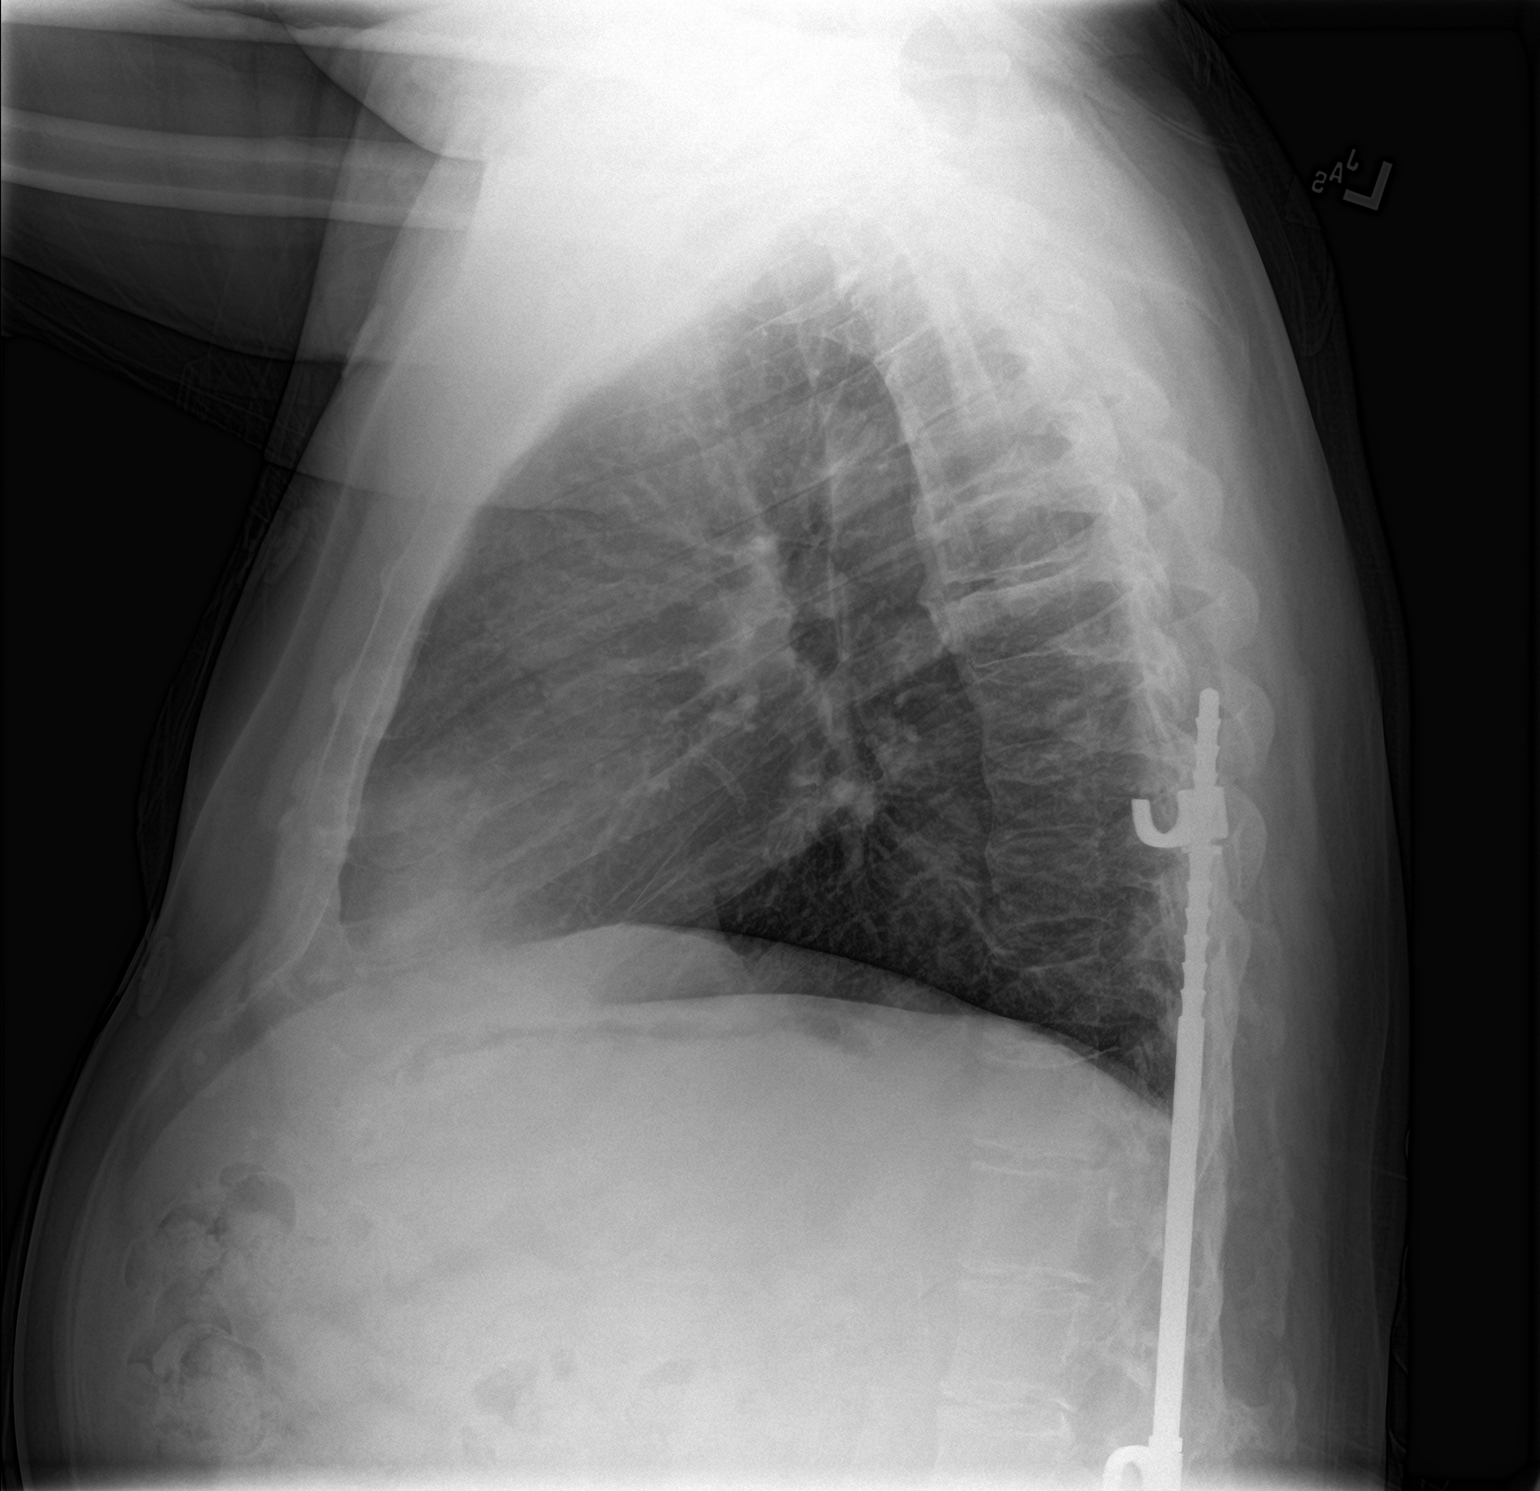

[2 of 2 positions shown; findings below may reference images not displayed]

FINDINGS: Trachea is midline. Heart size normal. Lungs are clear. No pleural
fluid. A single Harrington rod traverses the thoracolumbar spine.
Flowing anterior osteophytosis in the thoracic spine.
IMPRESSION: No acute findings.

## 2017-02-28 ENCOUNTER — Other Ambulatory Visit: Payer: Self-pay

## 2017-02-28 MED ORDER — CLOPIDOGREL BISULFATE 75 MG PO TABS: 75.0000 mg | ORAL_TABLET | Freq: Every day | ORAL | 1 refills | Status: DC

## 2017-03-30 ENCOUNTER — Telehealth: Payer: Self-pay

## 2017-03-30 ENCOUNTER — Other Ambulatory Visit: Payer: Self-pay

## 2017-03-30 MED ORDER — ROSUVASTATIN CALCIUM 40 MG PO TABS: 40.0000 mg | ORAL_TABLET | Freq: Every day | ORAL | 3 refills | Status: DC

## 2017-03-30 NOTE — Telephone Encounter (Signed)
error 

## 2017-04-06 DIAGNOSIS — M5416 Radiculopathy, lumbar region: Secondary | ICD-10-CM | POA: Diagnosis not present

## 2017-04-06 DIAGNOSIS — M961 Postlaminectomy syndrome, not elsewhere classified: Secondary | ICD-10-CM | POA: Diagnosis not present

## 2017-05-19 DIAGNOSIS — G5762 Lesion of plantar nerve, left lower limb: Secondary | ICD-10-CM | POA: Diagnosis not present

## 2017-05-19 DIAGNOSIS — M21962 Unspecified acquired deformity of left lower leg: Secondary | ICD-10-CM | POA: Diagnosis not present

## 2017-05-19 DIAGNOSIS — E139 Other specified diabetes mellitus without complications: Secondary | ICD-10-CM | POA: Diagnosis not present

## 2017-05-19 DIAGNOSIS — M205X2 Other deformities of toe(s) (acquired), left foot: Secondary | ICD-10-CM | POA: Diagnosis not present

## 2017-05-19 DIAGNOSIS — M79672 Pain in left foot: Secondary | ICD-10-CM | POA: Diagnosis not present

## 2017-06-02 DIAGNOSIS — G5762 Lesion of plantar nerve, left lower limb: Secondary | ICD-10-CM | POA: Diagnosis not present

## 2017-06-02 DIAGNOSIS — M205X2 Other deformities of toe(s) (acquired), left foot: Secondary | ICD-10-CM | POA: Diagnosis not present

## 2017-06-02 DIAGNOSIS — R2689 Other abnormalities of gait and mobility: Secondary | ICD-10-CM | POA: Diagnosis not present

## 2017-06-02 DIAGNOSIS — E139 Other specified diabetes mellitus without complications: Secondary | ICD-10-CM | POA: Diagnosis not present

## 2017-06-29 DIAGNOSIS — M5416 Radiculopathy, lumbar region: Secondary | ICD-10-CM | POA: Diagnosis not present

## 2017-06-29 DIAGNOSIS — M961 Postlaminectomy syndrome, not elsewhere classified: Secondary | ICD-10-CM | POA: Diagnosis not present

## 2017-07-26 DIAGNOSIS — M5416 Radiculopathy, lumbar region: Secondary | ICD-10-CM | POA: Diagnosis not present

## 2017-07-26 DIAGNOSIS — M961 Postlaminectomy syndrome, not elsewhere classified: Secondary | ICD-10-CM | POA: Diagnosis not present

## 2017-08-08 DIAGNOSIS — R2689 Other abnormalities of gait and mobility: Secondary | ICD-10-CM | POA: Diagnosis not present

## 2017-08-08 DIAGNOSIS — G5762 Lesion of plantar nerve, left lower limb: Secondary | ICD-10-CM | POA: Diagnosis not present

## 2017-08-08 DIAGNOSIS — E139 Other specified diabetes mellitus without complications: Secondary | ICD-10-CM | POA: Diagnosis not present

## 2017-08-09 ENCOUNTER — Other Ambulatory Visit: Payer: Self-pay | Admitting: Cardiology

## 2017-08-09 NOTE — Telephone Encounter (Signed)
New Message     *STAT* If patient is at the pharmacy, call can be transferred to refill team.   1. Which medications need to be refilled? (please list name of each medication and dose if known)   metoprolol (TOPROL-XL) 200 MG 24 hr tablet        2. Which pharmacy/location (including street and city if local pharmacy) is medication to be sent to [facility] [place] fax number [phone]   3. Do they need a 30 day or 90 day supply? 90 day

## 2017-08-11 MED ORDER — METOPROLOL SUCCINATE ER 200 MG PO TB24: 200.0000 mg | ORAL_TABLET | Freq: Every day | ORAL | 0 refills | Status: DC

## 2017-08-11 NOTE — Telephone Encounter (Signed)
Rx(s) sent to pharmacy electronically.  

## 2017-08-22 DIAGNOSIS — G5761 Lesion of plantar nerve, right lower limb: Secondary | ICD-10-CM | POA: Diagnosis not present

## 2017-08-22 DIAGNOSIS — E139 Other specified diabetes mellitus without complications: Secondary | ICD-10-CM | POA: Diagnosis not present

## 2017-08-22 DIAGNOSIS — G5762 Lesion of plantar nerve, left lower limb: Secondary | ICD-10-CM | POA: Diagnosis not present

## 2017-08-23 DIAGNOSIS — M961 Postlaminectomy syndrome, not elsewhere classified: Secondary | ICD-10-CM | POA: Diagnosis not present

## 2017-08-23 DIAGNOSIS — G8929 Other chronic pain: Secondary | ICD-10-CM | POA: Diagnosis not present

## 2017-08-23 DIAGNOSIS — M5416 Radiculopathy, lumbar region: Secondary | ICD-10-CM | POA: Diagnosis not present

## 2017-08-23 DIAGNOSIS — Z01818 Encounter for other preprocedural examination: Secondary | ICD-10-CM | POA: Diagnosis not present

## 2017-08-28 DIAGNOSIS — G5762 Lesion of plantar nerve, left lower limb: Secondary | ICD-10-CM | POA: Diagnosis not present

## 2017-08-28 DIAGNOSIS — G5761 Lesion of plantar nerve, right lower limb: Secondary | ICD-10-CM | POA: Diagnosis not present

## 2017-09-24 ENCOUNTER — Other Ambulatory Visit: Payer: Self-pay | Admitting: Cardiology

## 2017-09-26 ENCOUNTER — Other Ambulatory Visit: Payer: Self-pay

## 2017-09-26 ENCOUNTER — Telehealth: Payer: Self-pay | Admitting: Cardiology

## 2017-09-26 MED ORDER — ENALAPRIL MALEATE 20 MG PO TABS: 20.0000 mg | ORAL_TABLET | Freq: Two times a day (BID) | ORAL | 0 refills | Status: DC

## 2017-09-26 MED ORDER — ENALAPRIL MALEATE 20 MG PO TABS: 20.0000 mg | ORAL_TABLET | Freq: Two times a day (BID) | ORAL | 1 refills | Status: DC

## 2017-09-26 NOTE — Telephone Encounter (Signed)
Pt is requesting refill on Rx enalapril 20mg . Pt  Would like Rx sent to [facility] located [address], [place], [place] [address]. Please address.

## 2017-09-26 NOTE — Telephone Encounter (Signed)
New Message    *STAT* If patient is at the pharmacy, call can be transferred to refill team.   1. Which medications need to be refilled? (please list name of each medication and dose if known) enalapril 20mg   2. Which pharmacy/location (including street and city if local pharmacy) is medication to be sent to? [facility] located [address], [place], [place] [address].  3. Do they need a 30 day or 90 day supply?

## 2017-10-05 ENCOUNTER — Other Ambulatory Visit: Payer: Self-pay | Admitting: Cardiology

## 2017-11-02 NOTE — Progress Notes (Signed)
HPI The patient presents for followup after recent hospitalization for unstable angina.  He has a history of multiple coronary interventions.  He was in the hospital most recently in Jan with moderate 80% diagonal stenosis, proximal 95% circumflex stenosis before the stented area and 95% stenosis just beyond the stent area.  He had patent stents in the right coronary with 60-70% stenosis. He underwent DES stenting of his proximal circumflex and DES stenting of his mid circumflex.  He was in the hospital again in early January. He required PCI with drug-eluting stents 3 placed in the right coronary. His EF was 45% by LV gram. It was normal by echo with mild MR.    He presents for follow up.  He has done well.  The patient denies any new symptoms such as chest discomfort, neck or arm discomfort. There has been no new shortness of breath, PND or orthopnea. There have been no reported palpitations, presyncope or syncope.    He had some neuromas taken off of his foot and so he is been slow down a little bit but he still very active at work.   No Known Allergies  Current Outpatient Medications  Medication Sig Dispense Refill  . aspirin 81 MG chewable tablet Chew 1 tablet (81 mg total) by mouth daily.    . chlorthalidone (HYGROTON) 25 MG tablet TAKE 1/2 TABLET (12.5 MG TOTAL) BY MOUTH DAILY. 90 tablet 0  . clopidogrel (PLAVIX) 75 MG tablet TAKE 1 TABLET BY MOUTH EVERY DAY 90 tablet 1  . enalapril (VASOTEC) 20 MG tablet Take 1 tablet (20 mg total) by mouth 2 (two) times daily. 180 tablet 0  . lansoprazole (PREVACID) 30 MG capsule Take 30 mg by mouth daily.      . metFORMIN (GLUCOPHAGE-XR) 500 MG 24 hr tablet Take 500 mg by mouth every evening.     . methocarbamol (ROBAXIN) 500 MG tablet Take 500 mg by mouth every 8 (eight) hours as needed for muscle spasms.     . metoprolol (TOPROL-XL) 200 MG 24 hr tablet Take 1 tablet (200 mg total) by mouth daily. 90 tablet 0  . nitroGLYCERIN (NITROSTAT) 0.3 MG SL  tablet Place 1 tablet (0.3 mg total) under the tongue every 5 (five) minutes as needed for chest pain. 25 tablet 1  . oxyCODONE-acetaminophen (PERCOCET) 7.5-325 MG tablet Take 1 tablet by mouth every 6 (six) hours as needed for moderate pain.     . rosuvastatin (CRESTOR) 40 MG tablet Take 1 tablet (40 mg total) by mouth daily. 90 tablet 3   No current facility-administered medications for this visit.     Past Medical History:  Diagnosis Date  . Chronic lower back pain   . Chronic systolic (congestive) heart failure (HCC)    a. Dx 2006; b. 01/2015 EF 45-50% by LV gram. c. 02/2016: EF 45-50% by LV gram but 55% by echo  . Coronary artery disease    a. 2000 MI/stent to RCA & CFX;  b. 04/2005 PCI: pRCA stent 95% ISR (Cypher DES), L-R collats, EF 55%;  c. 04/2012 NSTEMI at [facility] in [place]: PCI of an occluded circumflex (2.5 x 28 mm DES). RCA 50-60% ISR, LAD 40-50%; d. 01/2015 Cath/PCI: LM nl, LAD 40p, D1 80, LCX 82m (3.0x12 Synergy DES), 95d (2.5x12 Syndergy DES), RCA 65p/m ISR, 60/60d  d. 02/2016: [place] s/p DESx3 RCA  . DDD (degenerative disc disease)   . GERD (gastroesophageal reflux disease)   . History of blood  transfusion 1978  . Hypercholesterolemia with hypertriglyceridemia   . Hypertensive heart disease   . Obesity   . Sciatic leg pain    "left side"  . Scoliosis   . Sleep apnea     Past Surgical History:  Procedure Laterality Date  . BACK SURGERY    . CARDIAC CATHETERIZATION  2006  . CARDIAC CATHETERIZATION N/A 01/23/2015   Procedure: Left Heart Cath and Coronary Angiography;  Surgeon: Kathleene Hazelhristopher D McAlhany, MD;  [facility];  Service: Cardiovascular;  Laterality: N/A;  . CARDIAC CATHETERIZATION N/A 01/23/2015   Procedure: Coronary Stent Intervention;  Surgeon: Kathleene Hazelhristopher D McAlhany, MD;  Location: [facility] INVASIVE CV LAB;  Service: Cardiovascular;  Laterality: N/A;  . CARDIAC CATHETERIZATION N/A 02/19/2016   Procedure: Left Heart Cath and Coronary  Angiography;  Surgeon: Marykay Lexavid W Harding, MD;  [facility];  Service: Cardiovascular;  Laterality: N/A;  . CARDIAC CATHETERIZATION N/A 02/19/2016   Procedure: Coronary Stent Intervention;  Surgeon: Marykay Lexavid W Harding, MD;  [facility];  Service: Cardiovascular;  Laterality: N/A;  . CORONARY ANGIOPLASTY WITH STENT PLACEMENT  ~ 2001 - 04/2012 X 4   "I've had a total of 4 placed; one is inside another" (01/22/2015)  . LUMBAR DISC SURGERY     1990's?  Marland Kitchen. SPINE SURGERY  1978   "Harrington rod placement"  . TONSILLECTOMY      ROS:  As stated in the HPI and negative for all other systems.  PHYSICAL EXAM BP 122/83   Pulse 66   Ht 5\' 4"  (1.626 m)   Wt 161 lb 9.6 oz (73.3 kg)   BMI 27.74 kg/m   GENERAL:  Well appearing NECK:  No jugular venous distention, waveform within normal limits, carotid upstroke brisk and symmetric, no bruits, no thyromegaly LUNGS:  Clear to auscultation bilaterally CHEST:  Unremarkable HEART:  PMI not displaced or sustained,S1 and S2 within normal limits, no S3, no S4, no clicks, no rubs, no murmurs ABD:  Flat, positive bowel sounds normal in frequency in pitch, no bruits, no rebound, no guarding, no midline pulsatile mass, no hepatomegaly, no splenomegaly EXT:  2 plus pulses throughout, no edema, no cyanosis no clubbing   EKG: Sinus rhythm, rate 66, leftward axis, no acute ST-T wave changes.  11/03/2017   ASSESSMENT AND PLAN  Coronary artery disease:      The patient has no new sypmtoms.  No further cardiovascular testing is indicated.  We will continue with aggressive risk reduction and meds as listed.  Hypertensive heart disease:      His BP is excellent.  No change in therapy.   Hyperlipidemia and hypertriglyceridemia:  His LDL was 44 but he is overdue and I will repeat this this morning.   Chronic systolic CHF/ICM: EF 55% by echo last year.  No change in therapy.

## 2017-11-03 ENCOUNTER — Encounter: Payer: Self-pay | Admitting: Cardiology

## 2017-11-03 ENCOUNTER — Ambulatory Visit: Payer: BLUE CROSS/BLUE SHIELD | Admitting: Cardiology

## 2017-11-03 VITALS — BP 122/83 | HR 66 | Ht 64.0 in | Wt 161.6 lb

## 2017-11-03 DIAGNOSIS — M961 Postlaminectomy syndrome, not elsewhere classified: Secondary | ICD-10-CM | POA: Diagnosis not present

## 2017-11-03 DIAGNOSIS — E785 Hyperlipidemia, unspecified: Secondary | ICD-10-CM

## 2017-11-03 DIAGNOSIS — I2510 Atherosclerotic heart disease of native coronary artery without angina pectoris: Secondary | ICD-10-CM | POA: Diagnosis not present

## 2017-11-03 DIAGNOSIS — Z79899 Other long term (current) drug therapy: Secondary | ICD-10-CM | POA: Diagnosis not present

## 2017-11-03 DIAGNOSIS — M5416 Radiculopathy, lumbar region: Secondary | ICD-10-CM | POA: Diagnosis not present

## 2017-11-03 DIAGNOSIS — I200 Unstable angina: Secondary | ICD-10-CM | POA: Diagnosis not present

## 2017-11-03 LAB — LIPID PANEL
CHOLESTEROL TOTAL: 224 mg/dL — AB (ref 100–199)
Chol/HDL Ratio: 5.6 ratio — ABNORMAL HIGH (ref 0.0–5.0)
HDL: 40 mg/dL (ref >39)
Triglycerides: 446 mg/dL — ABNORMAL HIGH (ref 0–149)

## 2017-11-03 LAB — HEPATIC FUNCTION PANEL
ALBUMIN: 4.9 g/dL (ref 3.5–5.5)
ALK PHOS: 66 IU/L (ref 39–117)
ALT: 14 IU/L (ref 0–44)
AST: 17 IU/L (ref 0–40)
BILIRUBIN, DIRECT: 0.2 mg/dL (ref 0.00–0.40)
Bilirubin Total: 0.8 mg/dL (ref 0.0–1.2)
TOTAL PROTEIN: 7.2 g/dL (ref 6.0–8.5)

## 2017-11-03 NOTE — Patient Instructions (Signed)
Medication Instructions:  Continue current medications  If you need a refill on your cardiac medications before your next appointment, please call your pharmacy.  Labwork: Fasting Lipids and Liver HERE IN OUR OFFICE AT [facility]  Take the provided lab slips with you to the lab for your blood draw.   You will need to fast. DO NOT EAT OR DRINK PAST MIDNIGHT.   Testing/Procedures: None Ordered  Special Instructions: (P) [phone] (F) [phone]  Follow-Up: Your physician wants you to follow-up in: 1 Year. You should receive a reminder letter in the mail two months in advance. If you do not receive a letter, please call our office in [phone] to schedule your follow-up appointment.     Thank you for choosing [facility] at [facility]!!

## 2017-11-13 ENCOUNTER — Telehealth: Payer: Self-pay | Admitting: *Deleted

## 2017-11-13 ENCOUNTER — Encounter: Payer: Self-pay | Admitting: *Deleted

## 2017-11-13 DIAGNOSIS — E785 Hyperlipidemia, unspecified: Secondary | ICD-10-CM

## 2017-11-13 NOTE — Telephone Encounter (Signed)
-----   Message from Rollene Rotunda, MD sent at 11/04/2017  9:59 AM EDT ----- His triglycerides are elevated.  No direct LDL.  Please order a direct LDL.  I would like him to see Dr. Rennis Golden in the Lipid clinic.  Please schedule an appt.

## 2017-11-13 NOTE — Telephone Encounter (Signed)
LDL ordered and mailed to pt

## 2017-11-14 DIAGNOSIS — H401132 Primary open-angle glaucoma, bilateral, moderate stage: Secondary | ICD-10-CM | POA: Diagnosis not present

## 2017-11-29 ENCOUNTER — Other Ambulatory Visit: Payer: Self-pay | Admitting: Cardiology

## 2017-11-29 NOTE — Telephone Encounter (Signed)
Rx request sent to pharmacy.  

## 2017-12-05 DIAGNOSIS — E139 Other specified diabetes mellitus without complications: Secondary | ICD-10-CM | POA: Diagnosis not present

## 2017-12-05 DIAGNOSIS — G5762 Lesion of plantar nerve, left lower limb: Secondary | ICD-10-CM | POA: Diagnosis not present

## 2017-12-06 DIAGNOSIS — M961 Postlaminectomy syndrome, not elsewhere classified: Secondary | ICD-10-CM | POA: Diagnosis not present

## 2017-12-06 DIAGNOSIS — M5416 Radiculopathy, lumbar region: Secondary | ICD-10-CM | POA: Diagnosis not present

## 2018-01-03 DIAGNOSIS — M961 Postlaminectomy syndrome, not elsewhere classified: Secondary | ICD-10-CM | POA: Diagnosis not present

## 2018-01-03 DIAGNOSIS — M5416 Radiculopathy, lumbar region: Secondary | ICD-10-CM | POA: Diagnosis not present

## 2018-02-19 DIAGNOSIS — H401132 Primary open-angle glaucoma, bilateral, moderate stage: Secondary | ICD-10-CM | POA: Diagnosis not present

## 2018-03-08 ENCOUNTER — Other Ambulatory Visit: Payer: Self-pay | Admitting: Cardiology

## 2018-03-17 IMAGING — DX DG CHEST 2V
2 series · 2 of 2 positions shown · non-contrast
Comparison: 01/22/2015

CLINICAL DATA: Chest tightness for several weeks.  Worse today.

EXAM:
CHEST  2 VIEW

[w chest pa]
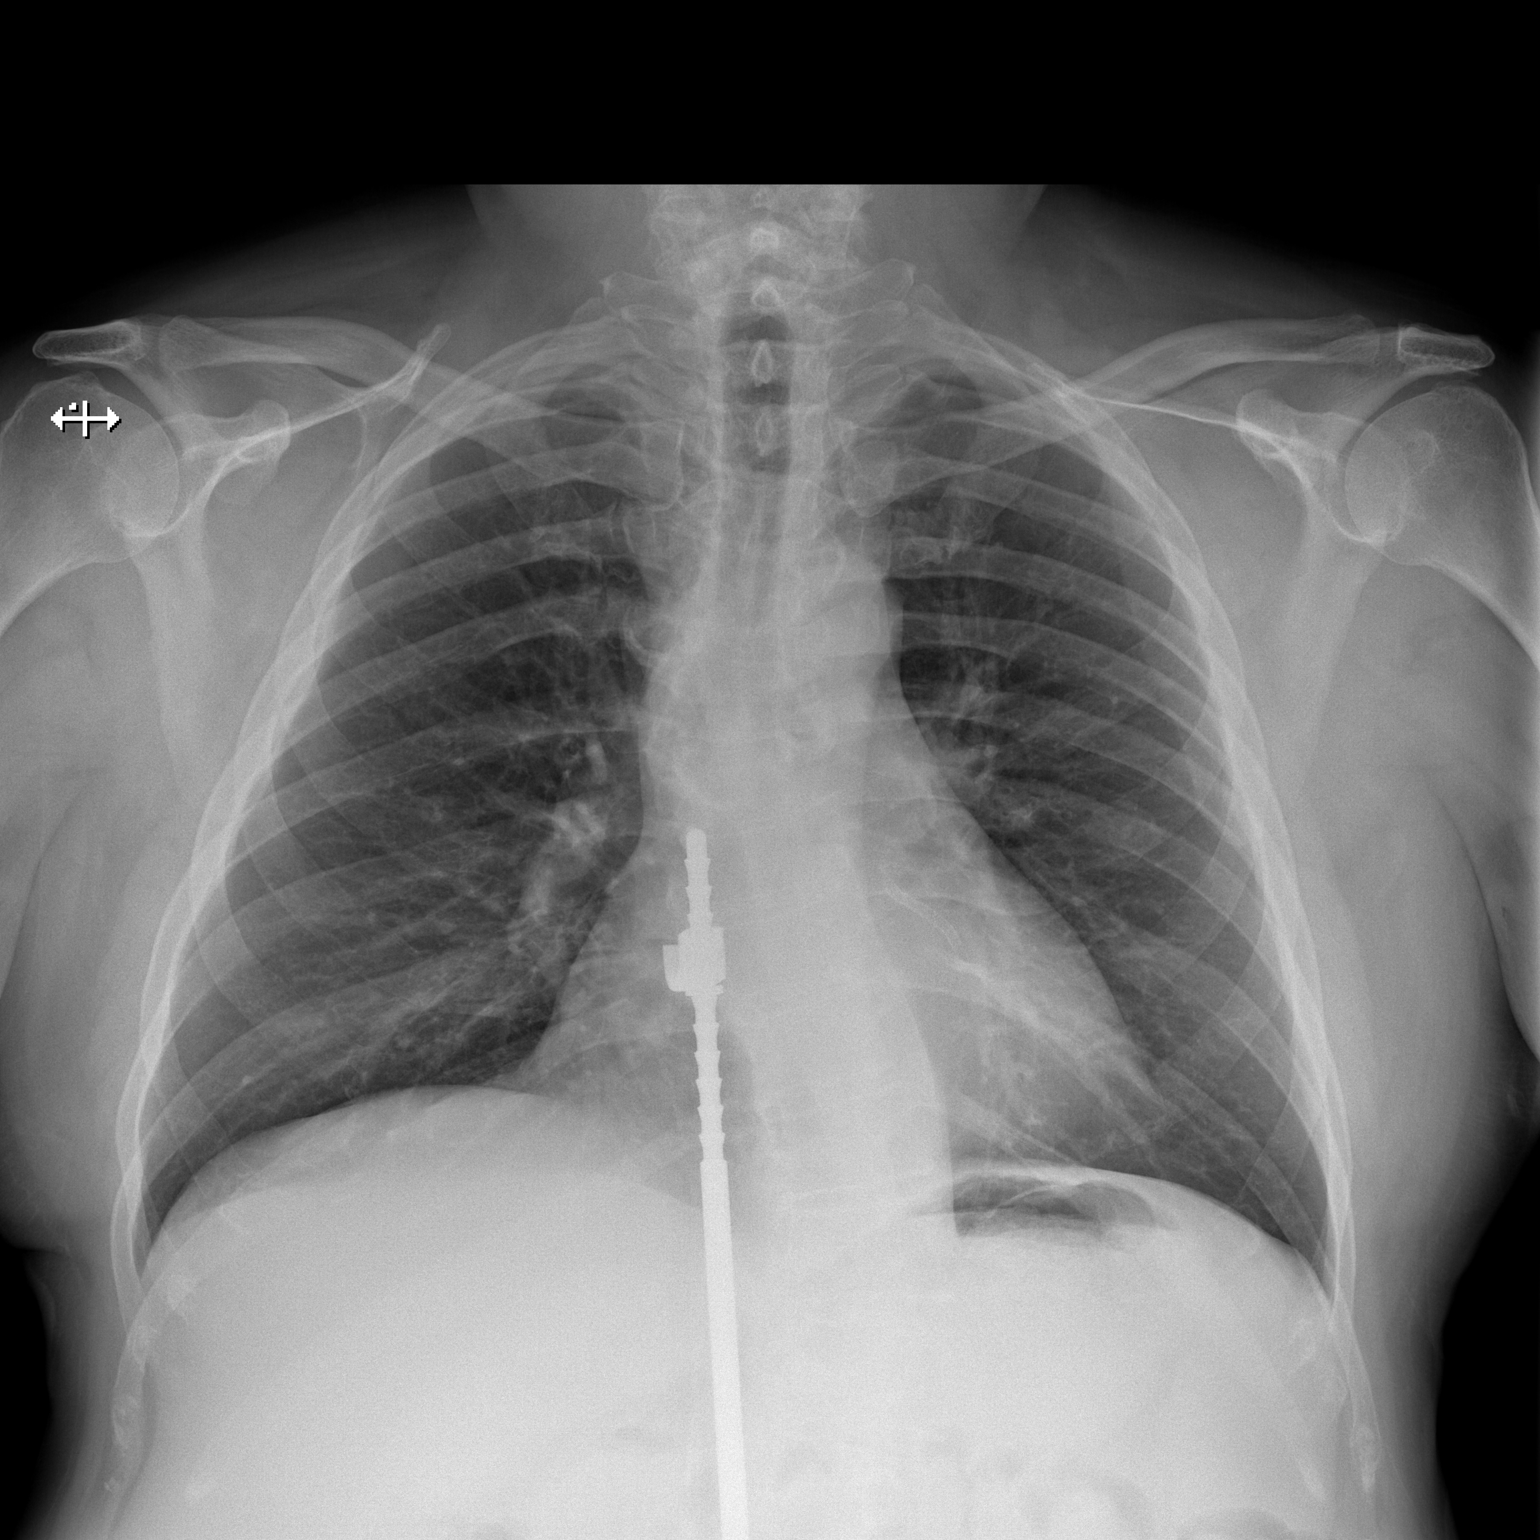

[w chest lat]
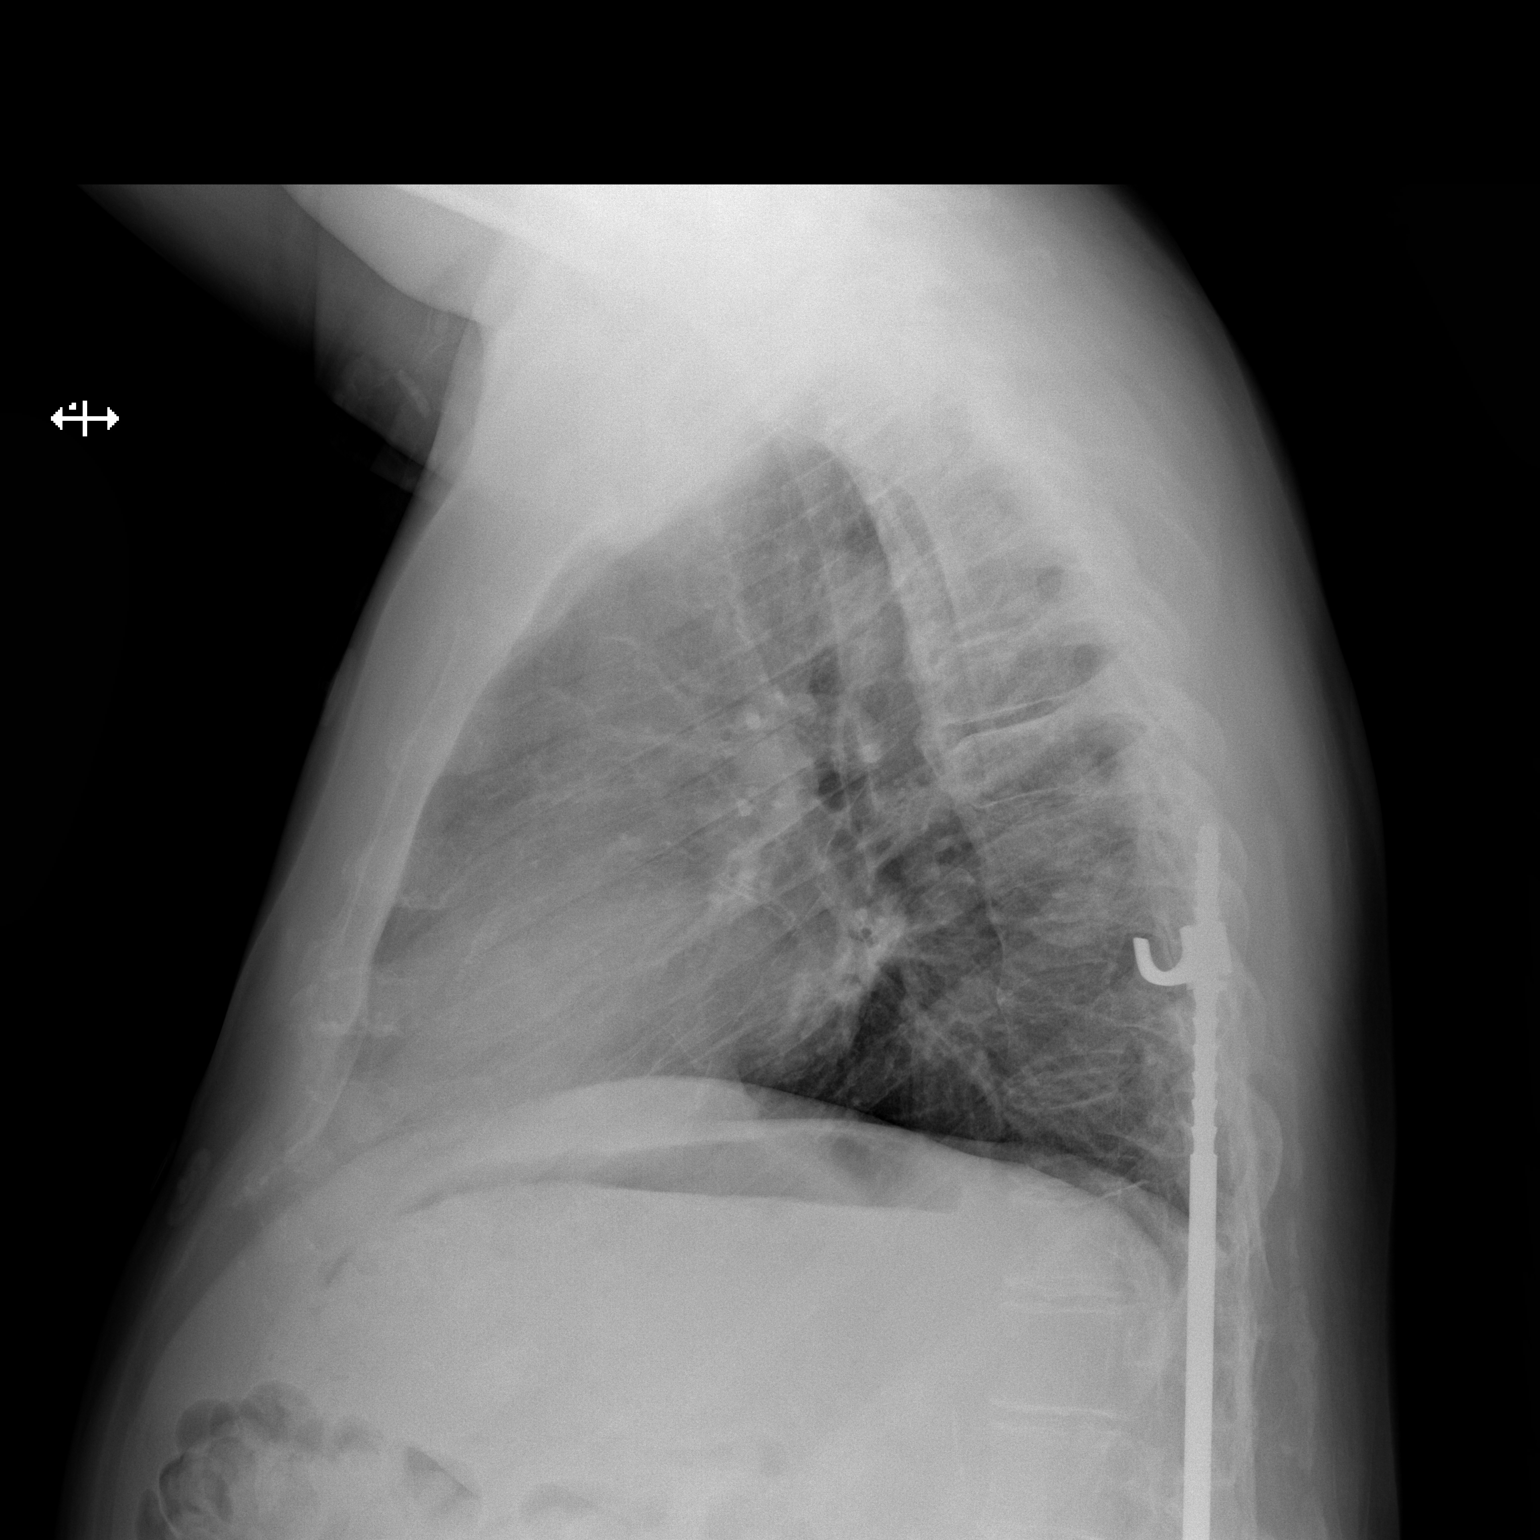

[2 of 2 positions shown; findings below may reference images not displayed]

FINDINGS: The heart size and mediastinal contours are within normal limits.
Both lungs are clear. The visualized skeletal structures are
unremarkable. Thoracolumbar Harrington rod incidentally noted.
Moderate thoracolumbar scoliosis.
IMPRESSION: No active cardiopulmonary disease.

## 2018-04-05 DIAGNOSIS — M961 Postlaminectomy syndrome, not elsewhere classified: Secondary | ICD-10-CM | POA: Diagnosis not present

## 2018-04-05 DIAGNOSIS — M5416 Radiculopathy, lumbar region: Secondary | ICD-10-CM | POA: Diagnosis not present

## 2018-04-05 DIAGNOSIS — G894 Chronic pain syndrome: Secondary | ICD-10-CM | POA: Diagnosis not present

## 2018-04-06 ENCOUNTER — Other Ambulatory Visit: Payer: Self-pay | Admitting: Cardiology

## 2018-04-06 NOTE — Telephone Encounter (Signed)
Rx(s) sent to pharmacy electronically.  

## 2018-05-23 ENCOUNTER — Other Ambulatory Visit: Payer: Self-pay | Admitting: Cardiology

## 2018-05-24 DIAGNOSIS — R7302 Impaired glucose tolerance (oral): Secondary | ICD-10-CM | POA: Diagnosis not present

## 2018-05-24 DIAGNOSIS — Z6826 Body mass index (BMI) 26.0-26.9, adult: Secondary | ICD-10-CM | POA: Diagnosis not present

## 2018-05-24 DIAGNOSIS — Z125 Encounter for screening for malignant neoplasm of prostate: Secondary | ICD-10-CM | POA: Diagnosis not present

## 2018-05-24 DIAGNOSIS — E782 Mixed hyperlipidemia: Secondary | ICD-10-CM | POA: Diagnosis not present

## 2018-05-24 DIAGNOSIS — I10 Essential (primary) hypertension: Secondary | ICD-10-CM | POA: Diagnosis not present

## 2018-05-28 DIAGNOSIS — E876 Hypokalemia: Secondary | ICD-10-CM | POA: Diagnosis not present

## 2018-05-30 ENCOUNTER — Other Ambulatory Visit: Payer: Self-pay

## 2018-05-30 MED ORDER — CLOPIDOGREL BISULFATE 75 MG PO TABS: 75.0000 mg | ORAL_TABLET | Freq: Every day | ORAL | 2 refills | Status: DC

## 2018-06-04 DIAGNOSIS — E876 Hypokalemia: Secondary | ICD-10-CM | POA: Diagnosis not present

## 2018-06-18 DIAGNOSIS — M5431 Sciatica, right side: Secondary | ICD-10-CM | POA: Diagnosis not present

## 2018-06-18 DIAGNOSIS — E876 Hypokalemia: Secondary | ICD-10-CM | POA: Diagnosis not present

## 2018-06-18 DIAGNOSIS — Z6827 Body mass index (BMI) 27.0-27.9, adult: Secondary | ICD-10-CM | POA: Diagnosis not present

## 2018-07-03 DIAGNOSIS — M5416 Radiculopathy, lumbar region: Secondary | ICD-10-CM | POA: Diagnosis not present

## 2018-07-03 DIAGNOSIS — G894 Chronic pain syndrome: Secondary | ICD-10-CM | POA: Diagnosis not present

## 2018-07-03 DIAGNOSIS — M961 Postlaminectomy syndrome, not elsewhere classified: Secondary | ICD-10-CM | POA: Diagnosis not present

## 2018-10-02 DIAGNOSIS — G894 Chronic pain syndrome: Secondary | ICD-10-CM | POA: Diagnosis not present

## 2018-10-02 DIAGNOSIS — M5416 Radiculopathy, lumbar region: Secondary | ICD-10-CM | POA: Diagnosis not present

## 2018-10-02 DIAGNOSIS — M961 Postlaminectomy syndrome, not elsewhere classified: Secondary | ICD-10-CM | POA: Diagnosis not present

## 2018-10-17 DIAGNOSIS — M961 Postlaminectomy syndrome, not elsewhere classified: Secondary | ICD-10-CM | POA: Diagnosis not present

## 2018-11-08 NOTE — Progress Notes (Signed)
HPI The patient presents for followup after recent hospitalization for unstable angina.  He has a history of multiple coronary interventions.  He was in the hospital most recently in Jan with moderate 80% diagonal stenosis, proximal 95% circumflex stenosis before the stented area and 95% stenosis just beyond the stent area.  He had patent stents in the right coronary with 60-70% stenosis. He underwent DES stenting of his proximal circumflex and DES stenting of his mid circumflex.  He was in the hospital again in early January of 2018. He required PCI with drug-eluting stents 3 placed in the right coronary. His EF was 45% by LV gram. It was normal by echo with mild MR.    He presents for follow up.  He is done very well since I last saw him.  He is walking routinely. The patient denies any new symptoms such as chest discomfort, neck or arm discomfort. There has been no new shortness of breath, PND or orthopnea. There have been no reported palpitations, presyncope or syncope.   No Known Allergies  Current Outpatient Medications  Medication Sig Dispense Refill  . aspirin 81 MG chewable tablet Chew 1 tablet (81 mg total) by mouth daily.    . chlorthalidone (HYGROTON) 25 MG tablet TAKE 1/2 TABLET BY MOUTH EVERY DAY 90 tablet 2  . clopidogrel (PLAVIX) 75 MG tablet Take 1 tablet (75 mg total) by mouth daily. 90 tablet 2  . enalapril (VASOTEC) 20 MG tablet TAKE 1 TABLET BY MOUTH TWICE A DAY 180 tablet 1  . HYDROcodone-acetaminophen (NORCO) 7.5-325 MG tablet Take 1 tablet by mouth every 8 (eight) hours as needed.    . lansoprazole (PREVACID) 30 MG capsule Take 30 mg by mouth daily.      . metFORMIN (GLUCOPHAGE-XR) 500 MG 24 hr tablet Take 500 mg by mouth every evening.     . methocarbamol (ROBAXIN) 500 MG tablet Take 500 mg by mouth every 8 (eight) hours as needed for muscle spasms.     . metoprolol (TOPROL-XL) 200 MG 24 hr tablet Take 1 tablet (200 mg total) by mouth daily. 90 tablet 0  .  metoprolol (TOPROL-XL) 200 MG 24 hr tablet TAKE 1 TABLET BY MOUTH DAILY 90 tablet 3  . nitroGLYCERIN (NITROSTAT) 0.3 MG SL tablet Place 1 tablet (0.3 mg total) under the tongue every 5 (five) minutes as needed for chest pain. 25 tablet 1  . rosuvastatin (CRESTOR) 40 MG tablet Take 1 tablet (40 mg total) by mouth daily. 90 tablet 2   No current facility-administered medications for this visit.     Past Medical History:  Diagnosis Date  . Chronic lower back pain   . Chronic systolic (congestive) heart failure ([place])    a. Dx 2006; b. 01/2015 EF 45-50% by LV gram. c. 02/2016: EF 45-50% by LV gram but 55% by echo  . Coronary artery disease    a. 2000 MI/stent to RCA & CFX;  b. 04/2005 PCI: pRCA stent 95% ISR (Cypher DES), L-R collats, EF 55%;  c. 04/2012 NSTEMI at [facility] in [place]: PCI of an occluded circumflex (2.5 x 28 mm DES). RCA 50-60% ISR, LAD 40-50%; d. 01/2015 Cath/PCI: LM nl, LAD 40p, D1 80, LCX 32m (3.0x12 Synergy DES), 95d (2.5x12 Syndergy DES), RCA 65p/m ISR, 60/60d  d. 02/2016: [place] s/p DESx3 RCA  . DDD (degenerative disc disease)   . GERD (gastroesophageal reflux disease)   . History of blood transfusion 1978  . Hypercholesterolemia with  hypertriglyceridemia   . Hypertensive heart disease   . Obesity   . Sciatic leg pain    "left side"  . Scoliosis   . Sleep apnea     Past Surgical History:  Procedure Laterality Date  . BACK SURGERY    . CARDIAC CATHETERIZATION  2006  . CARDIAC CATHETERIZATION N/A 01/23/2015   Procedure: Left Heart Cath and Coronary Angiography;  Surgeon: Kathleene Hazel, MD;  Location: [facility] INVASIVE CV LAB;  Service: Cardiovascular;  Laterality: N/A;  . CARDIAC CATHETERIZATION N/A 01/23/2015   Procedure: Coronary Stent Intervention;  Surgeon: Kathleene Hazel, MD;  Location: [facility] INVASIVE CV LAB;  Service: Cardiovascular;  Laterality: N/A;  . CARDIAC CATHETERIZATION N/A 02/19/2016   Procedure: Left Heart Cath and Coronary  Angiography;  Surgeon: Marykay Lex, MD;  Location: [facility] INVASIVE CV LAB;  Service: Cardiovascular;  Laterality: N/A;  . CARDIAC CATHETERIZATION N/A 02/19/2016   Procedure: Coronary Stent Intervention;  Surgeon: Marykay Lex, MD;  Location: [facility] INVASIVE CV LAB;  Service: Cardiovascular;  Laterality: N/A;  . CORONARY ANGIOPLASTY WITH STENT PLACEMENT  ~ 2001 - 04/2012 X 4   "I've had a total of 4 placed; one is inside another" (01/22/2015)  . LUMBAR DISC SURGERY     1990's?  Marland Kitchen SPINE SURGERY  1978   "Harrington rod placement"  . TONSILLECTOMY      ROS:  As stated in the HPI and negative for all other systems.  PHYSICAL EXAM BP 115/76   Pulse 77   Temp 98.1 F (36.7 C) (Temporal)   Ht 5\' 4"  (1.626 m)   Wt 170 lb (77.1 kg)   SpO2 99%   BMI 29.18 kg/m   GENERAL:  Well appearing NECK:  No jugular venous distention, waveform within normal limits, carotid upstroke brisk and symmetric, no bruits, no thyromegaly LUNGS:  Clear to auscultation bilaterally CHEST:  Unremarkable HEART:  PMI not displaced or sustained,S1 and S2 within normal limits, no S3, no S4, no clicks, no rubs, no murmurs ABD:  Flat, positive bowel sounds normal in frequency in pitch, no bruits, no rebound, no guarding, no midline pulsatile mass, no hepatomegaly, no splenomegaly EXT:  2 plus pulses throughout, no edema, no cyanosis no clubbing   EKG: Sinus rhythm, rate 77, leftward axis, no acute ST-T wave changes.  11/09/2018   ASSESSMENT AND PLAN  Coronary artery disease:      The patient has no new sypmtoms.  No further cardiovascular testing is indicated.  We will continue with aggressive risk reduction and meds as listed.  Hypertensive heart disease:      His BP is at target.  No change in therapy.   Hyperlipidemia and hypertriglyceridemia:  His LDL was not tested recently but it is due to be tested and he knows his goal is less than 70.  It was 44 previously.   Chronic systolic CHF/ICM: EF 55% by echo in 2018.    No change in therapy.  No further imaging.

## 2018-11-09 ENCOUNTER — Ambulatory Visit (INDEPENDENT_AMBULATORY_CARE_PROVIDER_SITE_OTHER): Payer: BC Managed Care – PPO | Admitting: Cardiology

## 2018-11-09 ENCOUNTER — Encounter: Payer: Self-pay | Admitting: Cardiology

## 2018-11-09 ENCOUNTER — Other Ambulatory Visit: Payer: Self-pay

## 2018-11-09 VITALS — BP 115/76 | HR 77 | Temp 98.1°F | Ht 64.0 in | Wt 170.0 lb

## 2018-11-09 DIAGNOSIS — I110 Hypertensive heart disease with heart failure: Secondary | ICD-10-CM | POA: Diagnosis not present

## 2018-11-09 DIAGNOSIS — E785 Hyperlipidemia, unspecified: Secondary | ICD-10-CM | POA: Diagnosis not present

## 2018-11-09 DIAGNOSIS — I2510 Atherosclerotic heart disease of native coronary artery without angina pectoris: Secondary | ICD-10-CM

## 2018-11-09 NOTE — Patient Instructions (Signed)
Medication Instructions:  Your physician recommends that you continue on your current medications as directed. Please refer to the Current Medication list given to you today.  If you need a refill on your cardiac medications before your next appointment, please call your pharmacy.   Lab work: NONE  Testing/Procedures: NONE  Follow-Up: At [facility], you and your health needs are our priority.  As part of our continuing mission to provide you with exceptional heart care, we have created designated Provider Care Teams.  These Care Teams include your primary Cardiologist (physician) and Advanced Practice Providers (APPs -  Physician Assistants and Nurse Practitioners) who all work together to provide you with the care you need, when you need it. You will need a follow up appointment in 12 months.  Please call our office 2 months in advance to schedule this appointment.  You may see Dr. Hochrein or one of the following Advanced Practice Providers on your designated Care Team:   Rhonda Barrett, PA-C Kathryn Lawrence, DNP, ANP      

## 2018-11-14 ENCOUNTER — Other Ambulatory Visit: Payer: Self-pay | Admitting: Cardiology

## 2018-12-05 DIAGNOSIS — M79672 Pain in left foot: Secondary | ICD-10-CM | POA: Diagnosis not present

## 2018-12-05 DIAGNOSIS — G5762 Lesion of plantar nerve, left lower limb: Secondary | ICD-10-CM | POA: Diagnosis not present

## 2018-12-05 DIAGNOSIS — M79671 Pain in right foot: Secondary | ICD-10-CM | POA: Diagnosis not present

## 2018-12-05 DIAGNOSIS — G5761 Lesion of plantar nerve, right lower limb: Secondary | ICD-10-CM | POA: Diagnosis not present

## 2018-12-12 ENCOUNTER — Other Ambulatory Visit: Payer: Self-pay | Admitting: Cardiology

## 2018-12-12 MED ORDER — METOPROLOL SUCCINATE ER 200 MG PO TB24: 200.0000 mg | ORAL_TABLET | Freq: Every day | ORAL | 2 refills | Status: DC

## 2018-12-12 NOTE — Telephone Encounter (Signed)
° ° ° °  Patient has no medication remaining   1. Which medications need to be refilled? (please list name of each medication and dose if known) metoprolol (TOPROL-XL) 200 MG 24 hr tablet  2. Which pharmacy/location (including street and city if local pharmacy) is medication to be sent to? [facility] [address] [place] 3. Do they need a 30 day or 90 day supply? [place]

## 2018-12-12 NOTE — Telephone Encounter (Signed)
Requested Prescriptions   Signed Prescriptions Disp Refills  . metoprolol (TOPROL-XL) 200 MG 24 hr tablet 90 tablet 2    Sig: Take 1 tablet (200 mg total) by mouth daily.    Authorizing Provider: Minus Breeding    Ordering User: Raelene Bott, Ayaka Andes L

## 2019-01-02 DIAGNOSIS — M961 Postlaminectomy syndrome, not elsewhere classified: Secondary | ICD-10-CM | POA: Diagnosis not present

## 2019-01-02 DIAGNOSIS — E119 Type 2 diabetes mellitus without complications: Secondary | ICD-10-CM | POA: Diagnosis not present

## 2019-01-02 DIAGNOSIS — M5416 Radiculopathy, lumbar region: Secondary | ICD-10-CM | POA: Diagnosis not present

## 2019-01-02 DIAGNOSIS — G894 Chronic pain syndrome: Secondary | ICD-10-CM | POA: Diagnosis not present

## 2019-02-11 DIAGNOSIS — J101 Influenza due to other identified influenza virus with other respiratory manifestations: Secondary | ICD-10-CM | POA: Diagnosis not present

## 2019-02-11 DIAGNOSIS — U071 COVID-19: Secondary | ICD-10-CM | POA: Diagnosis not present

## 2019-02-11 DIAGNOSIS — R05 Cough: Secondary | ICD-10-CM | POA: Diagnosis not present

## 2019-02-11 DIAGNOSIS — Z9189 Other specified personal risk factors, not elsewhere classified: Secondary | ICD-10-CM | POA: Diagnosis not present

## 2019-02-26 ENCOUNTER — Other Ambulatory Visit: Payer: Self-pay | Admitting: Cardiology

## 2019-03-27 DIAGNOSIS — M5416 Radiculopathy, lumbar region: Secondary | ICD-10-CM | POA: Diagnosis not present

## 2019-03-27 DIAGNOSIS — G894 Chronic pain syndrome: Secondary | ICD-10-CM | POA: Diagnosis not present

## 2019-03-27 DIAGNOSIS — M961 Postlaminectomy syndrome, not elsewhere classified: Secondary | ICD-10-CM | POA: Diagnosis not present

## 2019-04-09 DIAGNOSIS — H401132 Primary open-angle glaucoma, bilateral, moderate stage: Secondary | ICD-10-CM | POA: Diagnosis not present

## 2019-04-30 DIAGNOSIS — E782 Mixed hyperlipidemia: Secondary | ICD-10-CM | POA: Diagnosis not present

## 2019-04-30 DIAGNOSIS — M545 Low back pain: Secondary | ICD-10-CM | POA: Diagnosis not present

## 2019-04-30 DIAGNOSIS — I10 Essential (primary) hypertension: Secondary | ICD-10-CM | POA: Diagnosis not present

## 2019-04-30 DIAGNOSIS — E1129 Type 2 diabetes mellitus with other diabetic kidney complication: Secondary | ICD-10-CM | POA: Diagnosis not present

## 2019-04-30 DIAGNOSIS — Z6827 Body mass index (BMI) 27.0-27.9, adult: Secondary | ICD-10-CM | POA: Diagnosis not present

## 2019-05-09 DIAGNOSIS — E782 Mixed hyperlipidemia: Secondary | ICD-10-CM | POA: Diagnosis not present

## 2019-05-09 DIAGNOSIS — E1165 Type 2 diabetes mellitus with hyperglycemia: Secondary | ICD-10-CM | POA: Diagnosis not present

## 2019-05-09 DIAGNOSIS — Z1331 Encounter for screening for depression: Secondary | ICD-10-CM | POA: Diagnosis not present

## 2019-05-09 DIAGNOSIS — Z6826 Body mass index (BMI) 26.0-26.9, adult: Secondary | ICD-10-CM | POA: Diagnosis not present

## 2019-05-25 ENCOUNTER — Other Ambulatory Visit: Payer: Self-pay | Admitting: Cardiology

## 2019-05-30 DIAGNOSIS — Z1331 Encounter for screening for depression: Secondary | ICD-10-CM | POA: Diagnosis not present

## 2019-05-30 DIAGNOSIS — I10 Essential (primary) hypertension: Secondary | ICD-10-CM | POA: Diagnosis not present

## 2019-05-30 DIAGNOSIS — Z6826 Body mass index (BMI) 26.0-26.9, adult: Secondary | ICD-10-CM | POA: Diagnosis not present

## 2019-05-30 DIAGNOSIS — E1165 Type 2 diabetes mellitus with hyperglycemia: Secondary | ICD-10-CM | POA: Diagnosis not present

## 2019-07-11 DIAGNOSIS — Z79899 Other long term (current) drug therapy: Secondary | ICD-10-CM | POA: Diagnosis not present

## 2019-07-11 DIAGNOSIS — M961 Postlaminectomy syndrome, not elsewhere classified: Secondary | ICD-10-CM | POA: Diagnosis not present

## 2019-07-11 DIAGNOSIS — M5416 Radiculopathy, lumbar region: Secondary | ICD-10-CM | POA: Diagnosis not present

## 2019-07-11 DIAGNOSIS — G894 Chronic pain syndrome: Secondary | ICD-10-CM | POA: Diagnosis not present

## 2019-07-17 DIAGNOSIS — Z79899 Other long term (current) drug therapy: Secondary | ICD-10-CM | POA: Diagnosis not present

## 2019-08-22 DIAGNOSIS — M25512 Pain in left shoulder: Secondary | ICD-10-CM | POA: Diagnosis not present

## 2019-09-03 ENCOUNTER — Other Ambulatory Visit: Payer: Self-pay

## 2019-09-03 MED ORDER — METOPROLOL SUCCINATE ER 200 MG PO TB24: 200.0000 mg | ORAL_TABLET | Freq: Every day | ORAL | 0 refills | Status: DC

## 2019-09-13 DIAGNOSIS — E1165 Type 2 diabetes mellitus with hyperglycemia: Secondary | ICD-10-CM | POA: Diagnosis not present

## 2019-09-17 DIAGNOSIS — I10 Essential (primary) hypertension: Secondary | ICD-10-CM | POA: Diagnosis not present

## 2019-09-17 DIAGNOSIS — E782 Mixed hyperlipidemia: Secondary | ICD-10-CM | POA: Diagnosis not present

## 2019-09-17 DIAGNOSIS — E1129 Type 2 diabetes mellitus with other diabetic kidney complication: Secondary | ICD-10-CM | POA: Diagnosis not present

## 2019-09-17 DIAGNOSIS — Z6825 Body mass index (BMI) 25.0-25.9, adult: Secondary | ICD-10-CM | POA: Diagnosis not present

## 2019-10-03 DIAGNOSIS — M961 Postlaminectomy syndrome, not elsewhere classified: Secondary | ICD-10-CM | POA: Diagnosis not present

## 2019-10-03 DIAGNOSIS — G894 Chronic pain syndrome: Secondary | ICD-10-CM | POA: Diagnosis not present

## 2019-10-03 DIAGNOSIS — M5416 Radiculopathy, lumbar region: Secondary | ICD-10-CM | POA: Diagnosis not present

## 2019-10-28 ENCOUNTER — Other Ambulatory Visit: Payer: Self-pay | Admitting: Cardiology

## 2019-11-21 ENCOUNTER — Other Ambulatory Visit: Payer: Self-pay | Admitting: Cardiology

## 2019-11-22 ENCOUNTER — Ambulatory Visit: Payer: BC Managed Care – PPO | Admitting: Cardiology

## 2019-11-24 ENCOUNTER — Other Ambulatory Visit: Payer: Self-pay | Admitting: Cardiology

## 2019-12-04 ENCOUNTER — Other Ambulatory Visit: Payer: Self-pay | Admitting: Cardiology

## 2019-12-16 NOTE — Progress Notes (Signed)
Cardiology Office Note   Date:  12/17/2019   ID:  [name] [name], DOB 05/31/1960, MRN [id number]  PCP:  Noni Saupe, MD  Cardiologist:   Rollene Rotunda, MD   Chief Complaint  Patient presents with  . Coronary Artery Disease      History of Present Illness: [name] [name] is a 59 y.o. male who presents for followup after recent hospitalization for follow up of CAD.  He has a history of multiple coronary interventions.  He was in the hospital in Jan 2018 with moderate 80% diagonal stenosis, proximal 95% circumflex stenosis before the stented area and 95% stenosis just beyond the stent area.  He had patent stents in the right coronary with 60-70% stenosis. He underwent DES stenting of his proximal circumflex and DES stenting of his mid circumflex.  He was in the hospital again in early January of 2018. He required PCI with drug-eluting stents 3 placed in the right coronary. His EF was 45% by LV gram. It was normal by echo with mild MR.    He presents for follow up.  Since I last saw him he has had quite a bit of stress at home.  This stress has caused him to lose weight.  His 3 dogs died and so he was not walking as much.  Of note he does have a new dog and he started back to walking.  He still bit limited by his back pain.  However, from a cardiovascular standpoint he has done well. The patient denies any new symptoms such as chest discomfort, neck or arm discomfort. There has been no new shortness of breath, PND or orthopnea. There have been no reported palpitations, presyncope or syncope.   Past Medical History:  Diagnosis Date  . Chronic lower back pain   . Chronic systolic (congestive) heart failure ([facility])    a. Dx 2006; b. 01/2015 EF 45-50% by LV gram. c. 02/2016: EF 45-50% by LV gram but 55% by echo  . Coronary artery disease    a. 2000 MI/stent to RCA & CFX;  b. 04/2005 PCI: pRCA stent 95% ISR (Cypher DES), L-R collats, EF 55%;  c. 04/2012 NSTEMI at [facility] in  [place]: PCI of an occluded circumflex (2.5 x 28 mm DES). RCA 50-60% ISR, LAD 40-50%; d. 01/2015 Cath/PCI: LM nl, LAD 40p, D1 80, LCX 35m (3.0x12 Synergy DES), 95d (2.5x12 Syndergy DES), RCA 65p/m ISR, 60/60d  d. 02/2016: [place] s/p DESx3 RCA  . DDD (degenerative disc disease)   . GERD (gastroesophageal reflux disease)   . History of blood transfusion 1978  . Hypercholesterolemia with hypertriglyceridemia   . Hypertensive heart disease   . Obesity   . Sciatic leg pain    "left side"  . Scoliosis   . Sleep apnea     Past Surgical History:  Procedure Laterality Date  . BACK SURGERY    . CARDIAC CATHETERIZATION  2006  . CARDIAC CATHETERIZATION N/A 01/23/2015   Procedure: Left Heart Cath and Coronary Angiography;  Surgeon: Kathleene Hazel, MD;  Location: [facility] INVASIVE CV LAB;  Service: Cardiovascular;  Laterality: N/A;  . CARDIAC CATHETERIZATION N/A 01/23/2015   Procedure: Coronary Stent Intervention;  Surgeon: Kathleene Hazel, MD;  Location: [facility] INVASIVE CV LAB;  Service: Cardiovascular;  Laterality: N/A;  . CARDIAC CATHETERIZATION N/A 02/19/2016   Procedure: Left Heart Cath and Coronary Angiography;  Surgeon: Marykay Lex, MD;  Location: [facility] INVASIVE CV LAB;  Service: Cardiovascular;  Laterality:  N/A;  . CARDIAC CATHETERIZATION N/A 02/19/2016   Procedure: Coronary Stent Intervention;  Surgeon: Marykay Lex, MD;  Location: [facility] INVASIVE CV LAB;  Service: Cardiovascular;  Laterality: N/A;  . CORONARY ANGIOPLASTY WITH STENT PLACEMENT  ~ 2001 - 04/2012 X 4   "I've had a total of 4 placed; one is inside another" (01/22/2015)  . LUMBAR DISC SURGERY     1990's?  Marland Kitchen SPINE SURGERY  1978   "Harrington rod placement"  . TONSILLECTOMY       Current Outpatient Medications  Medication Sig Dispense Refill  . aspirin 81 MG chewable tablet Chew 1 tablet (81 mg total) by mouth daily.    . chlorthalidone (HYGROTON) 25 MG tablet TAKE 1/2 TABLET BY MOUTH EVERY DAY 45 tablet 1  . clopidogrel (PLAVIX)  75 MG tablet TAKE 1 TABLET BY MOUTH EVERY DAY 30 tablet 1  . enalapril (VASOTEC) 20 MG tablet TAKE 1 TABLET BY MOUTH TWICE A DAY 180 tablet 3  . HYDROcodone-acetaminophen (NORCO) 7.5-325 MG tablet Take 1 tablet by mouth every 8 (eight) hours as needed.    . lansoprazole (PREVACID) 30 MG capsule Take 30 mg by mouth daily.      . metFORMIN (GLUCOPHAGE-XR) 500 MG 24 hr tablet Take 500 mg by mouth in the morning and at bedtime.     . methocarbamol (ROBAXIN) 500 MG tablet Take 500 mg by mouth every 8 (eight) hours as needed for muscle spasms.     . metoprolol (TOPROL-XL) 200 MG 24 hr tablet Take 1 tablet (200 mg total) by mouth daily. KEEP OV. 90 tablet 0  . nitroGLYCERIN (NITROSTAT) 0.3 MG SL tablet Place 1 tablet (0.3 mg total) under the tongue every 5 (five) minutes as needed for chest pain. 25 tablet 11  . rosuvastatin (CRESTOR) 40 MG tablet TAKE 1 TABLET BY MOUTH EVERY DAY 90 tablet 3   No current facility-administered medications for this visit.    Allergies:   Patient has no known allergies.    ROS:  Please see the history of present illness.   Otherwise, review of systems are positive for none.   All other systems are reviewed and negative.    PHYSICAL EXAM: VS:  BP 106/76   Pulse 71   Ht 5\' 4"  (1.626 m)   Wt 152 lb 3.2 oz (69 kg)   SpO2 96%   BMI 26.13 kg/m  , BMI Body mass index is 26.13 kg/m. GENERAL:  Well appearing NECK:  No jugular venous distention, waveform within normal limits, carotid upstroke brisk and symmetric, no bruits, no thyromegaly LUNGS:  Clear to auscultation bilaterally CHEST:  Unremarkable HEART:  PMI not displaced or sustained,S1 and S2 within normal limits, no S3, no S4, no clicks, no rubs, no murmurs ABD:  Flat, positive bowel sounds normal in frequency in pitch, no bruits, no rebound, no guarding, no midline pulsatile mass, no hepatomegaly, no splenomegaly EXT:  2 plus pulses throughout, no edema, no cyanosis no clubbing   EKG:  EKG is ordered  today. The ekg ordered today demonstrates sinus rhythm, rate 71, left axis deviation, nonspecific diffuse T wave flattening.  Left anterior fascicular block   Recent Labs: No results found for requested labs within last 8760 hours.    Lipid Panel    Component Value Date/Time   CHOL 224 (H) 11/03/2017 0827   TRIG 446 (H) 11/03/2017 0827   HDL 40 11/03/2017 0827   CHOLHDL 5.6 (H) 11/03/2017 0827   CHOLHDL 6.8 01/23/2015 1025  VLDL UNABLE TO CALCULATE IF TRIGLYCERIDE OVER 400 mg/dL 76/73/4193 7902   LDLCALC Comment 11/03/2017 0827      Wt Readings from Last 3 Encounters:  12/17/19 152 lb 3.2 oz (69 kg)  11/09/18 170 lb (77.1 kg)  11/03/17 161 lb 9.6 oz (73.3 kg)      Other studies Reviewed: Additional studies/ records that were reviewed today include: Labs. Review of the above records demonstrates:  Please see elsewhere in the note.     ASSESSMENT AND PLAN:  Coronary artery disease:       The patient has no new sypmtoms.  No further cardiovascular testing is indicated.  We will continue with aggressive risk reduction and meds as listed.  Hypertensive heart disease:   His blood pressure is well controlled.  No change in therapy.  Hyperlipidemia and hypertriglyceridemia:  His LDL was 64.  HDL 32.  This was in July.  No change in therapy.   Chronic systolic CHF/ICM: EF 55% by echo in 2018.     No change in therapy.  DM: He is now on Jardiance.  His A1c is 6.2.  I will defer to Noni Saupe, MD    Current medicines are reviewed at length with the patient today.  The patient does not have concerns regarding medicines.  The following changes have been made:  no change  Labs/ tests ordered today include: None  Orders Placed This Encounter  Procedures  . EKG 12-Lead     Disposition:   FU with me in one year.     Signed, Rollene Rotunda, MD  12/17/2019 3:07 PM    [facility]

## 2019-12-17 ENCOUNTER — Other Ambulatory Visit: Payer: Self-pay

## 2019-12-17 ENCOUNTER — Ambulatory Visit: Payer: BC Managed Care – PPO | Admitting: Cardiology

## 2019-12-17 ENCOUNTER — Encounter: Payer: Self-pay | Admitting: Cardiology

## 2019-12-17 ENCOUNTER — Telehealth: Payer: Self-pay

## 2019-12-17 VITALS — BP 106/76 | HR 71 | Ht 64.0 in | Wt 152.2 lb

## 2019-12-17 DIAGNOSIS — I2510 Atherosclerotic heart disease of native coronary artery without angina pectoris: Secondary | ICD-10-CM | POA: Diagnosis not present

## 2019-12-17 DIAGNOSIS — I110 Hypertensive heart disease with heart failure: Secondary | ICD-10-CM | POA: Diagnosis not present

## 2019-12-17 DIAGNOSIS — E785 Hyperlipidemia, unspecified: Secondary | ICD-10-CM | POA: Diagnosis not present

## 2019-12-17 MED ORDER — NITROGLYCERIN 0.3 MG SL SUBL: 0.3000 mg | SUBLINGUAL_TABLET | SUBLINGUAL | 11 refills | Status: DC | PRN

## 2019-12-17 NOTE — Telephone Encounter (Signed)
Left message for pt indicating that the paperwork he requested from Dr. Antoine Poche for a handicap parking placard had been completed and would be mailed to him. Advised pt to call our office with any questions or concerns.

## 2019-12-17 NOTE — Patient Instructions (Signed)
Medication Instructions:  You nitroglycerin has been refilled *If you need a refill on your cardiac medications before your next appointment, please call your pharmacy*   Lab Work: None ordered If you have labs (blood work) drawn today and your tests are completely normal, you will receive your results only by: Marland Kitchen MyChart Message (if you have MyChart) OR . A paper copy in the mail If you have any lab test that is abnormal or we need to change your treatment, we will call you to review the results.   Testing/Procedures: None ordered   Follow-Up: At [facility], you and your health needs are our priority.  As part of our continuing mission to provide you with exceptional heart care, we have created designated Provider Care Teams.  These Care Teams include your primary Cardiologist (physician) and Advanced Practice Providers (APPs -  Physician Assistants and Nurse Practitioners) who all work together to provide you with the care you need, when you need it.  We recommend signing up for the patient portal called "MyChart".  Sign up information is provided on this After Visit Summary.  MyChart is used to connect with patients for Virtual Visits (Telemedicine).  Patients are able to view lab/test results, encounter notes, upcoming appointments, etc.  Non-urgent messages can be sent to your provider as well.   To learn more about what you can do with MyChart, go to [email or web address].    Your next appointment:   12 month(s)  The format for your next appointment:   In Person  Provider:   Rollene Rotunda, MD   Other Instructions None

## 2019-12-23 DIAGNOSIS — M5416 Radiculopathy, lumbar region: Secondary | ICD-10-CM | POA: Diagnosis not present

## 2019-12-23 DIAGNOSIS — G894 Chronic pain syndrome: Secondary | ICD-10-CM | POA: Diagnosis not present

## 2019-12-23 DIAGNOSIS — M961 Postlaminectomy syndrome, not elsewhere classified: Secondary | ICD-10-CM | POA: Diagnosis not present

## 2019-12-24 ENCOUNTER — Other Ambulatory Visit: Payer: Self-pay | Admitting: Cardiology

## 2020-01-29 ENCOUNTER — Other Ambulatory Visit: Payer: Self-pay | Admitting: Cardiology

## 2020-02-21 DIAGNOSIS — J40 Bronchitis, not specified as acute or chronic: Secondary | ICD-10-CM | POA: Diagnosis not present

## 2020-02-21 DIAGNOSIS — Z20828 Contact with and (suspected) exposure to other viral communicable diseases: Secondary | ICD-10-CM | POA: Diagnosis not present

## 2020-02-21 DIAGNOSIS — J329 Chronic sinusitis, unspecified: Secondary | ICD-10-CM | POA: Diagnosis not present

## 2020-02-21 DIAGNOSIS — E1129 Type 2 diabetes mellitus with other diabetic kidney complication: Secondary | ICD-10-CM | POA: Diagnosis not present

## 2020-03-03 ENCOUNTER — Other Ambulatory Visit: Payer: Self-pay | Admitting: Cardiology

## 2020-03-05 DIAGNOSIS — Z125 Encounter for screening for malignant neoplasm of prostate: Secondary | ICD-10-CM | POA: Diagnosis not present

## 2020-03-05 DIAGNOSIS — Z0000 Encounter for general adult medical examination without abnormal findings: Secondary | ICD-10-CM | POA: Diagnosis not present

## 2020-03-05 DIAGNOSIS — Z6826 Body mass index (BMI) 26.0-26.9, adult: Secondary | ICD-10-CM | POA: Diagnosis not present

## 2020-03-05 DIAGNOSIS — Z131 Encounter for screening for diabetes mellitus: Secondary | ICD-10-CM | POA: Diagnosis not present

## 2020-04-08 DIAGNOSIS — M5416 Radiculopathy, lumbar region: Secondary | ICD-10-CM | POA: Diagnosis not present

## 2020-04-08 DIAGNOSIS — M961 Postlaminectomy syndrome, not elsewhere classified: Secondary | ICD-10-CM | POA: Diagnosis not present

## 2020-04-08 DIAGNOSIS — G894 Chronic pain syndrome: Secondary | ICD-10-CM | POA: Diagnosis not present

## 2020-04-16 DIAGNOSIS — M961 Postlaminectomy syndrome, not elsewhere classified: Secondary | ICD-10-CM | POA: Diagnosis not present

## 2020-04-16 DIAGNOSIS — G894 Chronic pain syndrome: Secondary | ICD-10-CM | POA: Diagnosis not present

## 2020-04-16 DIAGNOSIS — M5416 Radiculopathy, lumbar region: Secondary | ICD-10-CM | POA: Diagnosis not present

## 2020-04-24 ENCOUNTER — Other Ambulatory Visit: Payer: Self-pay | Admitting: Cardiology

## 2020-04-30 DIAGNOSIS — M7742 Metatarsalgia, left foot: Secondary | ICD-10-CM | POA: Diagnosis not present

## 2020-05-25 ENCOUNTER — Other Ambulatory Visit: Payer: Self-pay | Admitting: Cardiology

## 2020-06-24 DIAGNOSIS — J329 Chronic sinusitis, unspecified: Secondary | ICD-10-CM | POA: Diagnosis not present

## 2020-06-24 DIAGNOSIS — Z20828 Contact with and (suspected) exposure to other viral communicable diseases: Secondary | ICD-10-CM | POA: Diagnosis not present

## 2020-06-24 DIAGNOSIS — J40 Bronchitis, not specified as acute or chronic: Secondary | ICD-10-CM | POA: Diagnosis not present

## 2020-07-01 DIAGNOSIS — G894 Chronic pain syndrome: Secondary | ICD-10-CM | POA: Diagnosis not present

## 2020-07-01 DIAGNOSIS — M961 Postlaminectomy syndrome, not elsewhere classified: Secondary | ICD-10-CM | POA: Diagnosis not present

## 2020-07-01 DIAGNOSIS — M5416 Radiculopathy, lumbar region: Secondary | ICD-10-CM | POA: Diagnosis not present

## 2020-10-01 DIAGNOSIS — G894 Chronic pain syndrome: Secondary | ICD-10-CM | POA: Diagnosis not present

## 2020-10-01 DIAGNOSIS — M961 Postlaminectomy syndrome, not elsewhere classified: Secondary | ICD-10-CM | POA: Diagnosis not present

## 2020-10-01 DIAGNOSIS — M5416 Radiculopathy, lumbar region: Secondary | ICD-10-CM | POA: Diagnosis not present

## 2020-10-16 DIAGNOSIS — I10 Essential (primary) hypertension: Secondary | ICD-10-CM | POA: Diagnosis not present

## 2020-10-16 DIAGNOSIS — Z6826 Body mass index (BMI) 26.0-26.9, adult: Secondary | ICD-10-CM | POA: Diagnosis not present

## 2020-10-16 DIAGNOSIS — E782 Mixed hyperlipidemia: Secondary | ICD-10-CM | POA: Diagnosis not present

## 2020-10-16 DIAGNOSIS — E1129 Type 2 diabetes mellitus with other diabetic kidney complication: Secondary | ICD-10-CM | POA: Diagnosis not present

## 2021-02-14 ENCOUNTER — Other Ambulatory Visit: Payer: Self-pay | Admitting: Cardiology

## 2021-03-04 DIAGNOSIS — E118 Type 2 diabetes mellitus with unspecified complications: Secondary | ICD-10-CM | POA: Insufficient documentation

## 2021-03-04 HISTORY — DX: Type 2 diabetes mellitus with unspecified complications: E11.8

## 2021-03-04 NOTE — Progress Notes (Signed)
Cardiology Office Note   Date:  03/05/2021   ID:  [name] [name], DOB 1960-06-08, MRN [id number]  PCP:  Noni Saupe, MD  Cardiologist:   [name] Rotunda, MD   Chief Complaint  Patient presents with   Coronary Artery Disease      History of Present Illness: [name] [name] is a 61 y.o. male who presents for followup after recent hospitalization for follow up of CAD.  He has a history of multiple coronary interventions.  He was in the hospital in Jan 2018 with moderate 80% diagonal stenosis, proximal 95% circumflex stenosis before the stented area and 95% stenosis just beyond the stent area.  He had patent stents in the right coronary with 60-70% stenosis. He underwent DES stenting of his proximal circumflex and DES stenting of his mid circumflex.  He was in the hospital again in early January of 2018. He required PCI with drug-eluting stents 3 placed in the right coronary. His EF was 45% by LV gram. It was normal by echo with mild MR 2018.    He presents for follow up.  Since I last saw him he has had no new cardiovascular problems.  He has had some foot problems so he is not been particularly active.  He walks his little dog. The patient denies any new symptoms such as chest discomfort, neck or arm discomfort. There has been no new shortness of breath, PND or orthopnea. There have been no reported palpitations, presyncope or syncope.     Past Medical History:  Diagnosis Date   Chronic lower back pain    Chronic systolic (congestive) heart failure (HCC)    a. Dx 2006; b. 01/2015 EF 45-50% by LV gram. c. 02/2016: EF 45-50% by LV gram but 55% by echo   Coronary artery disease    a. 2000 MI/stent to RCA & CFX;  b. 04/2005 PCI: pRCA stent 95% ISR (Cypher DES), L-R collats, EF 55%;  c. 04/2012 NSTEMI at [facility] in [place]: PCI of an occluded circumflex (2.5 x 28 mm DES). RCA 50-60% ISR, LAD 40-50%; d. 01/2015 Cath/PCI: LM nl, LAD 40p, D1 80, LCX 35m (3.0x12 Synergy DES),  95d (2.5x12 Syndergy DES), RCA 65p/m ISR, 60/60d  d. 02/2016: [place] s/p DESx3 RCA   DDD (degenerative disc disease)    GERD (gastroesophageal reflux disease)    History of blood transfusion 1978   Hypercholesterolemia with hypertriglyceridemia    Hypertensive heart disease    Obesity    Sciatic leg pain    "left side"   Scoliosis    Sleep apnea     Past Surgical History:  Procedure Laterality Date   BACK SURGERY     CARDIAC CATHETERIZATION  2006   CARDIAC CATHETERIZATION N/A 01/23/2015   Procedure: Left Heart Cath and Coronary Angiography;  Surgeon: Kathleene Hazel, MD;  Location: [facility] INVASIVE CV LAB;  Service: Cardiovascular;  Laterality: N/A;   CARDIAC CATHETERIZATION N/A 01/23/2015   Procedure: Coronary Stent Intervention;  Surgeon: Kathleene Hazel, MD;  Location: [facility] INVASIVE CV LAB;  Service: Cardiovascular;  Laterality: N/A;   CARDIAC CATHETERIZATION N/A 02/19/2016   Procedure: Left Heart Cath and Coronary Angiography;  Surgeon: Marykay Lex, MD;  Location: [facility] INVASIVE CV LAB;  Service: Cardiovascular;  Laterality: N/A;   CARDIAC CATHETERIZATION N/A 02/19/2016   Procedure: Coronary Stent Intervention;  Surgeon: Marykay Lex, MD;  Location: [facility] INVASIVE CV LAB;  Service: Cardiovascular;  Laterality: N/A;   CORONARY ANGIOPLASTY WITH  STENT PLACEMENT  ~ 2001 - 04/2012 X 4   "I've had a total of 4 placed; one is inside another" (01/22/2015)   LUMBAR DISC SURGERY     1990's?   SPINE SURGERY  1978   "Harrington rod placement"   TONSILLECTOMY       Current Outpatient Medications  Medication Sig Dispense Refill   aspirin 81 MG chewable tablet Chew 1 tablet (81 mg total) by mouth daily.     chlorthalidone (HYGROTON) 25 MG tablet TAKE 1/2 TABLET BY MOUTH EVERY DAY 45 tablet 9   clopidogrel (PLAVIX) 75 MG tablet TAKE 1 TABLET BY MOUTH EVERY DAY 90 tablet 3   empagliflozin (JARDIANCE) 10 MG TABS tablet Take 10 mg by mouth daily.     enalapril (VASOTEC) 20 MG tablet TAKE 1  TABLET BY MOUTH TWICE A DAY 180 tablet 3   HYDROcodone-acetaminophen (NORCO) 7.5-325 MG tablet Take 1 tablet by mouth every 8 (eight) hours as needed.     lansoprazole (PREVACID) 30 MG capsule Take 30 mg by mouth daily.       metFORMIN (GLUCOPHAGE-XR) 500 MG 24 hr tablet Take 500 mg by mouth in the morning and at bedtime.      methocarbamol (ROBAXIN) 500 MG tablet Take 500 mg by mouth every 8 (eight) hours as needed for muscle spasms.      metoprolol (TOPROL-XL) 200 MG 24 hr tablet Take 1 tablet (200 mg total) by mouth daily. . 90 tablet 3   nitroGLYCERIN (NITROSTAT) 0.3 MG SL tablet Place 1 tablet (0.3 mg total) under the tongue every 5 (five) minutes as needed for chest pain. 25 tablet 11   rosuvastatin (CRESTOR) 40 MG tablet TAKE 1 TABLET BY MOUTH EVERY DAY 90 tablet 3   No current facility-administered medications for this visit.    Allergies:   Patient has no known allergies.    ROS:  Please see the history of present illness.   Otherwise, review of systems are positive for none.   All other systems are reviewed and negative.    PHYSICAL EXAM: VS:  BP 106/70    Pulse 66    Ht 5\' 4"  (1.626 m)    Wt 164 lb (74.4 kg)    SpO2 97%    BMI 28.15 kg/m  , BMI Body mass index is 28.15 kg/m. GENERAL:  Well appearing NECK:  No jugular venous distention, waveform within normal limits, carotid upstroke brisk and symmetric, no bruits, no thyromegaly LUNGS:  Clear to auscultation bilaterally CHEST:  Unremarkable HEART:  PMI not displaced or sustained,S1 and S2 within normal limits, no S3, no S4, no clicks, no rubs, no murmurs ABD:  Flat, positive bowel sounds normal in frequency in pitch, no bruits, no rebound, no guarding, no midline pulsatile mass, no hepatomegaly, no splenomegaly EXT:  2 plus pulses throughout, no edema, no cyanosis no clubbing   EKG:  EKG is  ordered today. The ekg ordered today demonstrates sinus rhythm, rate 66, left axis deviation, nonspecific diffuse T wave flattening.   Left anterior fascicular block   Recent Labs: No results found for requested labs within last 8760 hours.    Lipid Panel    Component Value Date/Time   CHOL 224 (H) 11/03/2017 0827   TRIG 446 (H) 11/03/2017 0827   HDL 40 11/03/2017 0827   CHOLHDL 5.6 (H) 11/03/2017 0827   CHOLHDL 6.8 01/23/2015 1025   VLDL UNABLE TO CALCULATE IF TRIGLYCERIDE OVER 400 mg/dL 14/10/2014 38/88/7579   LDLCALC Comment 11/03/2017  0827      Wt Readings from Last 3 Encounters:  03/05/21 164 lb (74.4 kg)  12/17/19 152 lb 3.2 oz (69 kg)  11/09/18 170 lb (77.1 kg)      Other studies Reviewed: Additional studies/ records that were reviewed today include: Labs. Review of the above records demonstrates:  Please see elsewhere in the note.     ASSESSMENT AND PLAN:  Coronary artery disease:     The patient has no new sypmtoms.  No further cardiovascular testing is indicated.  We will continue with aggressive risk reduction and meds as listed.  Hypertensive heart disease:   Blood pressure is well controlled.  No change in therapy.  Hyperlipidemia and hypertriglyceridemia: I would like to see his most recent cholesterol profile.  Apparently he is going to have this repeated soon.  I do note that he had some high triglycerides in the past.  His total was only 125 in September.  HDL was 40.  I do not see his LDL reported however.    Chronic systolic CHF/ICM:  EF 55% by echo in 2018.   I would not suspect a change in this clinically.  No change in therapy.   DM: He is now on Jardiance.  His A1c is 5.7.  No change in therapy.     Current medicines are reviewed at length with the patient today.  The patient does not have concerns regarding medicines.  The following changes have been made:  no change  Labs/ tests ordered today include: None  Orders Placed This Encounter  Procedures   EKG 12-Lead     Disposition:   FU with me in one year.     Signed, [name] RotundaJames Talin Rozeboom, MD  03/05/2021 3:46 PM    [facility]

## 2021-03-05 ENCOUNTER — Encounter: Payer: Self-pay | Admitting: Cardiology

## 2021-03-05 ENCOUNTER — Ambulatory Visit: Payer: BC Managed Care – PPO | Admitting: Cardiology

## 2021-03-05 ENCOUNTER — Other Ambulatory Visit: Payer: Self-pay

## 2021-03-05 VITALS — BP 106/70 | HR 66 | Ht 64.0 in | Wt 164.0 lb

## 2021-03-05 DIAGNOSIS — E785 Hyperlipidemia, unspecified: Secondary | ICD-10-CM | POA: Diagnosis not present

## 2021-03-05 DIAGNOSIS — I5022 Chronic systolic (congestive) heart failure: Secondary | ICD-10-CM

## 2021-03-05 DIAGNOSIS — E118 Type 2 diabetes mellitus with unspecified complications: Secondary | ICD-10-CM

## 2021-03-05 DIAGNOSIS — I2510 Atherosclerotic heart disease of native coronary artery without angina pectoris: Secondary | ICD-10-CM | POA: Diagnosis not present

## 2021-03-05 NOTE — Patient Instructions (Signed)
Medication Instructions:  ?No changes ?*If you need a refill on your cardiac medications before your next appointment, please call your pharmacy* ? ? ?Lab Work: ?None ordered ?If you have labs (blood work) drawn today and your tests are completely normal, you will receive your results only by: ?MyChart Message (if you have MyChart) OR ?A paper copy in the mail ?If you have any lab test that is abnormal or we need to change your treatment, we will call you to review the results. ? ? ?Testing/Procedures: ?None ordered ? ? ?Follow-Up: ?At [facility], you and your health needs are our priority.  As part of our continuing mission to provide you with exceptional heart care, we have created designated Provider Care Teams.  These Care Teams include your primary Cardiologist (physician) and Advanced Practice Providers (APPs -  Physician Assistants and Nurse Practitioners) who all work together to provide you with the care you need, when you need it. ? ?We recommend signing up for the patient portal called "MyChart".  Sign up information is provided on this After Visit Summary.  MyChart is used to connect with patients for Virtual Visits (Telemedicine).  Patients are able to view lab/test results, encounter notes, upcoming appointments, etc.  Non-urgent messages can be sent to your provider as well.   ?To learn more about what you can do with MyChart, go to [email or web address].   ? ?Your next appointment:   ?12 month(s) ? ?The format for your next appointment:   ?In Person ? ?Provider:   ?James Hochrein, MD { ? ? ?

## 2021-03-10 DIAGNOSIS — M7742 Metatarsalgia, left foot: Secondary | ICD-10-CM | POA: Diagnosis not present

## 2021-03-17 DIAGNOSIS — K219 Gastro-esophageal reflux disease without esophagitis: Secondary | ICD-10-CM | POA: Diagnosis not present

## 2021-03-24 ENCOUNTER — Telehealth: Payer: Self-pay | Admitting: *Deleted

## 2021-03-24 NOTE — Telephone Encounter (Signed)
Clydie Braun from [facility] states the anesthesia is propofol.

## 2021-03-24 NOTE — Telephone Encounter (Signed)
° °  Pre-operative Risk Assessment    Patient Name: [name] [name]  DOB: 08-Dec-1960 MRN: [id number]      Request for Surgical Clearance    Procedure:   COLONOSCOPY  Date of Surgery:  Clearance 04/08/21                                 Surgeon:  DR. Marcial Pacas MISENHEIMER Surgeon's Group or Practice Name:  [facility] Phone number:  [phone] Fax number:  [phone]   Type of Clearance Requested:   - Medical  - Pharmacy:  Hold Aspirin and Clopidogrel (Plavix) REQUESTING TO HOLD BOTH ASA AND PLAVIX BEGINNING 04/03/21   Type of Anesthesia:  Not Indicated; LEFT MESSAGE FOR REQUESTING OFFICE TO CALL WITH TYPE OF ANESTHESIA TO BE USED   Additional requests/questions:    Elpidio Anis   03/24/2021, 10:09 AM

## 2021-03-24 NOTE — Telephone Encounter (Signed)
° °  Patient Name: [name] [name]  DOB: 1960/03/05 MRN: [id number]  Primary Cardiologist: Rollene Rotunda, MD  Chart reviewed as part of pre-operative protocol coverage. Last saw Dr Antoine Poche recently on 02/2021, doing well that OV. He has history of CAD with multiple interventions, last in 2018 with "Successful DES PCI to extensive segments in the RCA including significant in-stent restenosis segments as well as the notable lesions in the distal RCA." Echo showed EF 55% with mild MR at that time. Will route to Dr. Antoine Poche to inquire whether patient can hold ASA + Plavix for colonoscopy. Will likely plan to recommend continuing ASA if able, but since they are requesting both, need MD input (sometimes both are requested if polypectomy bleeding expected). Dr. Antoine Poche - Please route response to P CV DIV PREOP (the pre-op pool). Thank you.   Laurann Montana, PA-C 03/24/2021, 1:41 PM

## 2021-03-25 NOTE — Telephone Encounter (Signed)
Left a message for the patient to call back and speak to the on-call preop APP of the day 

## 2021-03-29 NOTE — Telephone Encounter (Signed)
° °  Name: [name] [name]  DOB: 1960/05/28  MRN: [id number]   Primary Cardiologist: Rollene Rotunda, MD  Chart reviewed as part of pre-operative protocol coverage. I reached out to patient for update on how he is doing. The patient affirms he has been doing well without any new cardiac symptoms. Therefore, based on ACC/AHA guidelines, the patient would be at acceptable risk for the planned procedure without further cardiovascular testing. The patient was advised that if he develops new symptoms prior to surgery to contact our office to arrange for a follow-up visit, and he verbalized understanding.  Per Dr. Antoine Poche, he does not want the patient to hold aspirin. He has granted the patient permission to hold Plavix as requested for the procedure.  I will route this recommendation to the requesting party via Epic fax function and remove from pre-op pool. Please call with questions.  Laurann Montana, PA-C 03/29/2021, 9:30 AM

## 2021-04-01 DIAGNOSIS — G894 Chronic pain syndrome: Secondary | ICD-10-CM | POA: Diagnosis not present

## 2021-04-01 DIAGNOSIS — M961 Postlaminectomy syndrome, not elsewhere classified: Secondary | ICD-10-CM | POA: Diagnosis not present

## 2021-04-01 DIAGNOSIS — M5416 Radiculopathy, lumbar region: Secondary | ICD-10-CM | POA: Diagnosis not present

## 2021-04-08 DIAGNOSIS — K635 Polyp of colon: Secondary | ICD-10-CM | POA: Diagnosis not present

## 2021-04-08 DIAGNOSIS — D125 Benign neoplasm of sigmoid colon: Secondary | ICD-10-CM | POA: Diagnosis not present

## 2021-04-08 DIAGNOSIS — I252 Old myocardial infarction: Secondary | ICD-10-CM | POA: Diagnosis not present

## 2021-04-08 DIAGNOSIS — R1310 Dysphagia, unspecified: Secondary | ICD-10-CM | POA: Diagnosis not present

## 2021-04-08 DIAGNOSIS — Z86010 Personal history of colonic polyps: Secondary | ICD-10-CM | POA: Diagnosis not present

## 2021-04-08 DIAGNOSIS — K644 Residual hemorrhoidal skin tags: Secondary | ICD-10-CM | POA: Diagnosis not present

## 2021-04-08 DIAGNOSIS — K219 Gastro-esophageal reflux disease without esophagitis: Secondary | ICD-10-CM | POA: Diagnosis not present

## 2021-04-08 DIAGNOSIS — I10 Essential (primary) hypertension: Secondary | ICD-10-CM | POA: Diagnosis not present

## 2021-04-08 DIAGNOSIS — D126 Benign neoplasm of colon, unspecified: Secondary | ICD-10-CM | POA: Diagnosis not present

## 2021-04-08 DIAGNOSIS — D124 Benign neoplasm of descending colon: Secondary | ICD-10-CM | POA: Diagnosis not present

## 2021-04-08 DIAGNOSIS — K317 Polyp of stomach and duodenum: Secondary | ICD-10-CM | POA: Diagnosis not present

## 2021-04-08 DIAGNOSIS — K222 Esophageal obstruction: Secondary | ICD-10-CM | POA: Diagnosis not present

## 2021-04-08 DIAGNOSIS — E119 Type 2 diabetes mellitus without complications: Secondary | ICD-10-CM | POA: Diagnosis not present

## 2021-04-08 DIAGNOSIS — Z1211 Encounter for screening for malignant neoplasm of colon: Secondary | ICD-10-CM | POA: Diagnosis not present

## 2021-04-14 DIAGNOSIS — M545 Low back pain, unspecified: Secondary | ICD-10-CM | POA: Diagnosis not present

## 2021-04-14 DIAGNOSIS — Z6827 Body mass index (BMI) 27.0-27.9, adult: Secondary | ICD-10-CM | POA: Diagnosis not present

## 2021-04-29 ENCOUNTER — Other Ambulatory Visit: Payer: Self-pay | Admitting: Cardiology

## 2021-05-27 DIAGNOSIS — E119 Type 2 diabetes mellitus without complications: Secondary | ICD-10-CM | POA: Diagnosis not present

## 2021-06-29 DIAGNOSIS — M961 Postlaminectomy syndrome, not elsewhere classified: Secondary | ICD-10-CM | POA: Diagnosis not present

## 2021-06-29 DIAGNOSIS — G894 Chronic pain syndrome: Secondary | ICD-10-CM | POA: Diagnosis not present

## 2021-06-29 DIAGNOSIS — M5416 Radiculopathy, lumbar region: Secondary | ICD-10-CM | POA: Diagnosis not present

## 2021-10-07 DIAGNOSIS — M961 Postlaminectomy syndrome, not elsewhere classified: Secondary | ICD-10-CM | POA: Diagnosis not present

## 2021-10-07 DIAGNOSIS — Z79899 Other long term (current) drug therapy: Secondary | ICD-10-CM | POA: Diagnosis not present

## 2021-10-07 DIAGNOSIS — G894 Chronic pain syndrome: Secondary | ICD-10-CM | POA: Diagnosis not present

## 2021-10-07 DIAGNOSIS — M5416 Radiculopathy, lumbar region: Secondary | ICD-10-CM | POA: Diagnosis not present

## 2021-10-08 DIAGNOSIS — G894 Chronic pain syndrome: Secondary | ICD-10-CM | POA: Diagnosis not present

## 2021-10-08 DIAGNOSIS — Z79899 Other long term (current) drug therapy: Secondary | ICD-10-CM | POA: Diagnosis not present

## 2021-10-08 DIAGNOSIS — M961 Postlaminectomy syndrome, not elsewhere classified: Secondary | ICD-10-CM | POA: Diagnosis not present

## 2021-10-08 DIAGNOSIS — M5416 Radiculopathy, lumbar region: Secondary | ICD-10-CM | POA: Diagnosis not present

## 2022-01-20 DIAGNOSIS — M961 Postlaminectomy syndrome, not elsewhere classified: Secondary | ICD-10-CM | POA: Diagnosis not present

## 2022-01-20 DIAGNOSIS — G894 Chronic pain syndrome: Secondary | ICD-10-CM | POA: Diagnosis not present

## 2022-01-20 DIAGNOSIS — M5416 Radiculopathy, lumbar region: Secondary | ICD-10-CM | POA: Diagnosis not present

## 2022-02-22 ENCOUNTER — Other Ambulatory Visit: Payer: Self-pay | Admitting: Cardiology

## 2022-04-05 DIAGNOSIS — Z6827 Body mass index (BMI) 27.0-27.9, adult: Secondary | ICD-10-CM | POA: Diagnosis not present

## 2022-04-05 DIAGNOSIS — Z125 Encounter for screening for malignant neoplasm of prostate: Secondary | ICD-10-CM | POA: Diagnosis not present

## 2022-04-05 DIAGNOSIS — Z131 Encounter for screening for diabetes mellitus: Secondary | ICD-10-CM | POA: Diagnosis not present

## 2022-04-05 DIAGNOSIS — Z0000 Encounter for general adult medical examination without abnormal findings: Secondary | ICD-10-CM | POA: Diagnosis not present

## 2022-04-09 ENCOUNTER — Other Ambulatory Visit: Payer: Self-pay | Admitting: Cardiology

## 2022-04-21 DIAGNOSIS — M961 Postlaminectomy syndrome, not elsewhere classified: Secondary | ICD-10-CM | POA: Diagnosis not present

## 2022-04-21 DIAGNOSIS — G894 Chronic pain syndrome: Secondary | ICD-10-CM | POA: Diagnosis not present

## 2022-04-21 DIAGNOSIS — Z79899 Other long term (current) drug therapy: Secondary | ICD-10-CM | POA: Diagnosis not present

## 2022-04-21 DIAGNOSIS — M5416 Radiculopathy, lumbar region: Secondary | ICD-10-CM | POA: Diagnosis not present

## 2022-06-18 ENCOUNTER — Other Ambulatory Visit: Payer: Self-pay | Admitting: Cardiology

## 2022-07-19 ENCOUNTER — Telehealth: Payer: Self-pay | Admitting: Cardiology

## 2022-07-19 NOTE — Telephone Encounter (Signed)
New Message:     Patient says he just does not feel right and have weakness when he exert himself. Patient has an appointment for Monday(07-25-22). Please call to evaluate.

## 2022-07-19 NOTE — Telephone Encounter (Signed)
Patient state weakness on exertion over the last few weeks.  Worse with hot weather.  Feels like heart working more than it normally did and feels week. No SOB at rest.  No chest pain. Feels like retaining fluid.  States some in legs if up too much.  Not bad and varies day.  Advised to elevate legs and avoid high sodium intake. He is advised if SOB to the point of distress, chest pain with sweating or nausea or pounding heart rate, he should go to ED. He will be seen on Monday in office with NP.

## 2022-07-22 DIAGNOSIS — G894 Chronic pain syndrome: Secondary | ICD-10-CM | POA: Diagnosis not present

## 2022-07-22 DIAGNOSIS — M961 Postlaminectomy syndrome, not elsewhere classified: Secondary | ICD-10-CM | POA: Diagnosis not present

## 2022-07-22 DIAGNOSIS — M5416 Radiculopathy, lumbar region: Secondary | ICD-10-CM | POA: Diagnosis not present

## 2022-07-25 ENCOUNTER — Ambulatory Visit (HOSPITAL_BASED_OUTPATIENT_CLINIC_OR_DEPARTMENT_OTHER): Payer: BC Managed Care – PPO | Admitting: Family

## 2022-07-25 ENCOUNTER — Encounter (HOSPITAL_BASED_OUTPATIENT_CLINIC_OR_DEPARTMENT_OTHER): Payer: Self-pay | Admitting: Family

## 2022-07-25 VITALS — BP 107/71 | HR 65 | Ht 65.0 in | Wt 166.5 lb

## 2022-07-25 DIAGNOSIS — R002 Palpitations: Secondary | ICD-10-CM

## 2022-07-25 DIAGNOSIS — I25118 Atherosclerotic heart disease of native coronary artery with other forms of angina pectoris: Secondary | ICD-10-CM

## 2022-07-25 DIAGNOSIS — E785 Hyperlipidemia, unspecified: Secondary | ICD-10-CM

## 2022-07-25 DIAGNOSIS — I10 Essential (primary) hypertension: Secondary | ICD-10-CM | POA: Diagnosis not present

## 2022-07-25 DIAGNOSIS — E118 Type 2 diabetes mellitus with unspecified complications: Secondary | ICD-10-CM | POA: Diagnosis not present

## 2022-07-25 DIAGNOSIS — Z7984 Long term (current) use of oral hypoglycemic drugs: Secondary | ICD-10-CM

## 2022-07-25 MED ORDER — DILTIAZEM HCL 30 MG PO TABS: 30.0000 mg | ORAL_TABLET | Freq: Two times a day (BID) | ORAL | 1 refills | Status: DC | PRN

## 2022-07-25 MED ORDER — CHLORTHALIDONE 25 MG PO TABS: 25.0000 mg | ORAL_TABLET | Freq: Every day | ORAL | 3 refills | Status: DC

## 2022-07-25 MED ORDER — NITROGLYCERIN 0.3 MG SL SUBL: 0.3000 mg | SUBLINGUAL_TABLET | SUBLINGUAL | 11 refills | Status: DC | PRN

## 2022-07-25 MED ORDER — CLOPIDOGREL BISULFATE 75 MG PO TABS
75.0000 mg | ORAL_TABLET | Freq: Every day | ORAL | 3 refills | Status: DC
Start: 2022-07-25 — End: 2023-09-11

## 2022-07-25 NOTE — Progress Notes (Unsigned)
Office Visit    Patient Name: [name] [name] Date of Encounter: 07/25/2022  PCP:  Noni Saupe, MD   [facility]  Cardiologist:  Rollene Rotunda, MD  Advanced Practice Provider:  No care team member to display Electrophysiologist:  None      Chief Complaint    [name] is a 62 y.o. male presents today for palpitations  Past Medical History    Past Medical History:  Diagnosis Date   Chronic lower back pain    Chronic systolic (congestive) heart failure (HCC)    a. Dx 2006; b. 01/2015 EF 45-50% by LV gram. c. 02/2016: EF 45-50% by LV gram but 55% by echo   Coronary artery disease    a. 2000 MI/stent to RCA & CFX;  b. 04/2005 PCI: pRCA stent 95% ISR (Cypher DES), L-R collats, EF 55%;  c. 04/2012 NSTEMI at [facility] in [place]: PCI of an occluded circumflex (2.5 x 28 mm DES). RCA 50-60% ISR, LAD 40-50%; d. 01/2015 Cath/PCI: LM nl, LAD 40p, D1 80, LCX 20m (3.0x12 Synergy DES), 95d (2.5x12 Syndergy DES), RCA 65p/m ISR, 60/60d  d. 02/2016: [place] s/p DESx3 RCA   DDD (degenerative disc disease)    GERD (gastroesophageal reflux disease)    History of blood transfusion 1978   Hypercholesterolemia with hypertriglyceridemia    Hypertensive heart disease    Obesity    Sciatic leg pain    "left side"   Scoliosis    Sleep apnea    Past Surgical History:  Procedure Laterality Date   BACK SURGERY     CARDIAC CATHETERIZATION  2006   CARDIAC CATHETERIZATION N/A 01/23/2015   Procedure: Left Heart Cath and Coronary Angiography;  Surgeon: Kathleene Hazel, MD;  Location: [facility] INVASIVE CV LAB;  Service: Cardiovascular;  Laterality: N/A;   CARDIAC CATHETERIZATION N/A 01/23/2015   Procedure: Coronary Stent Intervention;  Surgeon: Kathleene Hazel, MD;  Location: [facility] INVASIVE CV LAB;  Service: Cardiovascular;  Laterality: N/A;   CARDIAC CATHETERIZATION N/A 02/19/2016   Procedure: Left Heart Cath and Coronary Angiography;  Surgeon: Marykay Lex,  MD;  Location: [facility] INVASIVE CV LAB;  Service: Cardiovascular;  Laterality: N/A;   CARDIAC CATHETERIZATION N/A 02/19/2016   Procedure: Coronary Stent Intervention;  Surgeon: Marykay Lex, MD;  Location: [facility] INVASIVE CV LAB;  Service: Cardiovascular;  Laterality: N/A;   CORONARY ANGIOPLASTY WITH STENT PLACEMENT  ~ 2001 - 04/2012 X 4   "I've had a total of 4 placed; one is inside another" (01/22/2015)   LUMBAR DISC SURGERY     1990's?   SPINE SURGERY  1978   "Harrington rod placement"   TONSILLECTOMY      Allergies  No Known Allergies  History of Present Illness    [name] is a 62 y.o. male with a hx of CAD, hypertensive heart disease, HLD, hypertriglyceridemia, ischemic cardiomyopathy, DM2 last seen 03/05/21.  Multiple prior coronary interventions. LHC 01/2015 with patent RCA stents (2 layers of mid stented segment - most recent 2007) with placement of DES-proxCx and DES-midCx. LHC 02/2016 with DESx3 to RCA. LVEF 45% by LV gram. Echo at that time with LVEF 55%  Last seen 03/05/21 by Dr. Antoine Poche doing well recommended to follow up in one year.   Presents today independently. Notes recent stressors caring for his elderly mother with dementia. A few times overnight has woken up with palpitations which took a long time to calm down. Felt his pulse on  his radial artery and it felt fast. Not associated with lightheadedness, dizziness. Notes some fatigue, exercise intolerance since the warmer months. Notes it is variable and worse than in years past. Isolated episode of chest tightness was not even long enough to take a nitroglycerin. Enjoys walking with his dog for exercise. Does note difficulty splitting his Chlorthalidone in half.   EKGs/Labs/Other Studies Reviewed:   The following studies were reviewed today: Cardiac Studies & Procedures   CARDIAC CATHETERIZATION  CARDIAC CATHETERIZATION 02/19/2016  Narrative Images from the original result were not included.   Mid RCA-1 lesion, 95  %stenosed - proximal stent edge. Mid RCA-2 stented segment, 75 % in-stent re- stenosed Followed by 65% distal to crux..  Dist RCA-2 lesion, 95 %stenosed - focal lesion at the takeoff of small tandem PDA.  Prox LAD lesion, 40 %stenosed. Ost 1st Diag to 1st Diag lesion, 85 %stenosed. Stable from previous catheterizations  Ost Cx to Prox Cx recently placed Synergy DES stent 3.0 mm x 12 mm overlapping prior stent Cypher stent, 0 %stenosed.  Ost 1st Mrg to 1st Mrg extensive stented segment with previous Cypher DES 2.5 mm x 28 mm overlap distally with recently placed Synergy 2.5 x 12 mm stent., 0 %stenosed.  Dist RCA-1 lesion, 40 %stenosed. In between mid RCA lesion and distal severe lesion.  The left ventricular ejection fraction is 45-50% by visual estimate.  LV end diastolic pressure is mildly elevated.  --- CULPRIT LESIONS ----  --- LESION #1 Dist RCA-2 lesion, 95 %stenosed - focal lesion at the takeoff of small tandem PDA.  A STENT SYNERGY DES [id number] drug eluting stent was successfully placed, and does not overlap previously placed stent.  Post intervention, there is a 0% residual stenosis.  --- LESIONS #2 - Mid RCA-1 lesion, 95 %stenosed - proximal stent edge. Mid RCA-2 stented segment, 75 % in-stent re- stenosed Followed by 65% distal to crux..  A 2nd STENT SYNERGY DES 3X38 drug eluting stent was successfully placed (postdilated to 3.6 m), and overlaps previously placed stented segment and extends distal. .  A STENT SYNERGY DES 3.5X12 drug-eluting stent was successfully placed covering the proximal edge of the previously placed stent overlapping the new Synergy DES into the more proximal RCA.. - Postdilated to 3.7 mm  Post intervention, there is a 0% residual stenosis.  Successful DES PCI to extensive segments in the RCA including significant in-stent restenosis segments as well as the notable lesions in the distal RCA. Widely patent previously placed stents in the Circumflex-OM -  most recently from December 2016  Plan:  Transfer to 6 Central post procedure unit for TR band removal and post PCI care.  Expected discharge tomorrow.  Continue aspirin plus Plavix indefinitely.  Continue aggressive cardiac risk factor modification.  He will follow-up with Dr. Gildardo Cranker, M.D., M.S. Interventional Cardiologist  Pager # [phone] Phone # [phone] [address] [place], [place] [address]  Findings Coronary Findings Diagnostic  Dominance: Right  Left Main Vessel is large.  Left Anterior Descending The vessel exhibits minimal luminal irregularities. The lesion is segmental, tubular and smooth.  First Diagonal Branch The lesion is discrete. The lesion is calcified. Similar to several prior reports and angiographic images.  First Septal Branch Vessel is small in size. Vessel is angiographically normal.  Second Diagonal Branch Vessel is small in size. Vessel is angiographically normal.  Second Septal Branch Vessel is small in size. Vessel is angiographically normal.  Third Septal Branch Vessel is  small in size.  Ramus Intermedius Vessel is small. Vessel is angiographically normal.  Left Circumflex Vessel is large. Previously placed Ost Cx to Prox Cx drug eluting stent is widely patent. Overlapped prior Cypher DES 01/2015  First Obtuse Marginal Branch Vessel is large in size. Previously placed Ost 1st Mrg to 1st Mrg drug eluting stent is widely patent. Initial BMS 2000, then 2007, 2014 &amp; 2016  Lateral First Obtuse Marginal Branch Vessel is small in size.  Right Coronary Artery Vessel is large.  The lesion is segmental and irregular. Several focal areas of in-stent restenosis. This continues at 65% distal to the stent until the crux. The lesion was previously treated using a drug eluting stent over 2 years ago. Previously placed stent displays restenosis. The lesion continues distal to the stent to the  crux The lesion is discrete, tubular and smooth. The lesion is located at the major branch and discrete. At the first small tandem PDA  Acute Marginal Branch Vessel is small in size.  Right Posterior Descending Artery Vessel is moderate in size. The vessel exhibits minimal luminal irregularities.  Inferior Septal Vessel is small in size. Courses of the tandem PDA  Right Posterior Atrioventricular Artery Vessel is moderate in size.  First Right Posterolateral Branch Vessel is small in size. The vessel exhibits minimal luminal irregularities.  Second Right Posterolateral Branch Vessel is small in size. The vessel exhibits minimal luminal irregularities.  Intervention  Mid RCA-1 lesion Angioplasty (Also treats lesions: Mid RCA-2) Lesion length: 48 mm. Lesion crossed with guidewire using a WIRE RUNTHROUGH .[id number]. Pre-stent angioplasty was performed using a BALLOON ANGIOSCULPT RX 3.0X15. Maximum pressure: 12 atm. Inflation time: 20 sec. Initial predilation: BALLOON EMERGE MR 2.5X12 (54098) - several inflations to 12 atm for 20 seconds A STENT SYNERGY DES 3X38 drug eluting stent was successfully placed, and overlaps previously placed stent. Minimum lumen area: 3.6 mm. Stent strut is well apposed. Post-stent angioplasty was performed using a BALLOON [place] MOZEC 3.5X15. Maximum pressure: 18 atm. Inflation time: 30 sec. Pre-stent angioplasty was performed using a BALLOON ANGIOSCULPT RX 3.0X15. Maximum pressure: 14 atm. Inflation time: 30 sec. A STENT SYNERGY DES 3.5X12 drug-eluting stent was successfully placed. Minimum lumen area: 3.7 mm. Stent strut is well apposed. Stent 2 overlaps at the proximal edge. Post-stent angioplasty was performed using a BALLOON [place] MOZEC 3.5X15. Maximum pressure: 20 atm. Inflation time: 30 sec. The pre-interventional distal flow is normal (TIMI 3).  The post-interventional distal flow is normal (TIMI 3). The intervention was successful . No complications occurred at  this lesion. CATH VISTA GUIDE 6FR JR4 ([id number]) There is a 0% residual stenosis post intervention.  Mid RCA-2 lesion Angioplasty (Also treats lesions: Mid RCA-1) See details in Mid RCA-1 lesion. There is a 0% residual stenosis post intervention.  Dist RCA-2 lesion Angioplasty Lesion length: 10 mm. Lesion crossed with guidewire using a WIRE RUNTHROUGH .[id number]. Pre-stent angioplasty was performed using a BALLOON [place]ER[place] MR 2.5X12. Maximum pressure: 12 atm. Inflation time: 20 sec. A STENT SYNERGY DES [id number] drug eluting stent was successfully placed, and does not overlap previously placed stent. Minimum lumen area: 2.9 mm. Stent strut is well apposed. Post-stent angioplasty was performed using a STENT SYNERGY DES [id number]. Maximum pressure: 20 atm. Inflation time: 30 sec. With stent balloon to high atmospheres The pre-interventional distal flow is normal (TIMI 3).  The post-interventional distal flow is normal (TIMI 3). The intervention was successful . No complications occurred at this lesion. CATH VISTA GUIDE 6FR JR4 ([id number]) There  is a 0% residual stenosis post intervention.   CARDIAC CATHETERIZATION  CARDIAC CATHETERIZATION 01/23/2015  Narrative 1. Triple vessel CAD. 2. The LAD has moderate diffuse disease proximally, none of which appears to be flow limiting. There is a moderate caliber diagonal branch with ostial 80% stenosis. Based on prior caths, this has not changed and is not felt to be responsible for his change in clinical symptoms. 3. The Circumflex terminates into an OM branch. The proximal vessel has a 95% stenosis just before the stented segment. There is diffuse 30% restenosis in the stented segment (2 layers of stents). Just beyond the stent there is a focal 95% stenosis in the mid vessel. 4. The RCA is a large dominant vessel with a mid stented segment (2 layers of stents, most recently Cypher DES in 2007). The stented segment has diffuse 60-70% restenosis. None of these  lesions are severe. 5. Mild LV systolic dysfunction 6. Successful PTCA/DES x 1 proximal Circumflex 7. Successful PTCA/DES x 1 mid Circumflex.  Recommendations: Continue ASA and Plavix for lifetime. If he has recurrent chest pain, consider FFR of RCA although treatment of the stented segment would likely neccesitate a third layer of stent. The Diagonal branch has been diseased for years and described as such in cath reports dating back to 2005.  Findings Coronary Findings Diagnostic  Dominance: Right  Left Anterior Descending Diffuse.  First Personnel officer.  Second Diagonal Branch The vessel is small in size.  Left Circumflex . Vessel is moderate in size. Discrete. Diffuse. The lesion was previously treated with a drug-eluting stent greater than two years ago. Discrete.  Right Coronary Artery Discrete. Diffuse. The lesion was previously treated with a drug-eluting stent greater than two years ago. Discrete. Discrete.  Intervention  Mid Cx-1 lesion PCI The pre-interventional distal flow is normal (TIMI 3). Pre-stent angioplasty was performed. A drug-eluting stent was placed. The strut is apposed. Post-stent angioplasty was performed. The post-interventional distal flow is normal (TIMI 3). The intervention was successful. No complications occurred at this lesion. There is a 0% residual stenosis post intervention.  Dist Cx lesion PCI The pre-interventional distal flow is normal (TIMI 3). Pre-stent angioplasty was performed. A drug-eluting stent was placed. The strut is apposed. Post-stent angioplasty was performed. The post-interventional distal flow is normal (TIMI 3). The intervention was successful. No complications occurred at this lesion. There is a 0% residual stenosis post intervention.     ECHOCARDIOGRAM  ECHOCARDIOGRAM COMPLETE 02/19/2016  Narrative *[place]* *[facility]* [address] [place], [place]  [address] [phone]  ------------------------------------------------------------------- Transthoracic Echocardiography  Patient:    Wills, Elmendorf MR #:       [id number] Study Date: 02/19/2016 Gender:     M Age:        55 Height:     162.6 cm Weight:     70.3 kg BSA:        1.8 m^2 Pt. Status: Room:       3W14C  ADMITTING    [name] Lemma, MD PERFORMING   [facility], Inpatient SONOGRAPHER  Lysbeth Galas, RDCS ATTENDING    [facility] Apley H ORDERING     [facility] A[facility]y H REFERRING    [place], [place] H  cc:  ------------------------------------------------------------------- LV EF: 55%  ------------------------------------------------------------------- Indications:      Chest pain 786.51.  ------------------------------------------------------------------- History:   PMH:  Hypertensive Heart Disease.  Coronary artery disease.  Congestive heart failure.  ------------------------------------------------------------------- Study Conclusions  - Left ventricle: Distal septal hypokinesis The cavity  size was normal. Systolic function was normal. The estimated ejection fraction was 55%. Wall motion was normal; there were no regional wall motion abnormalities. Left ventricular diastolic function parameters were normal. - Mitral valve: There was mild regurgitation.  ------------------------------------------------------------------- Study data:  Comparison was made to the study of 04/29/2012.  Study status:  Routine.  Procedure:  The patient reported no pain pre or post test. Transthoracic echocardiography. Image quality was adequate.  Study completion:  There were no complications. Transthoracic echocardiography.  M-mode, complete 2D, spectral Doppler, and color Doppler.  Birthdate:  Patient birthdate: 12/21/1960.  Age:  Patient is 62 yr old.  Sex:  Gender: male. BMI: 26.6 kg/m^2.  Blood pressure:     111/69  Patient status: Inpatient.  Study date:  Study date: 02/19/2016. Study  time: 11:07 AM.  Location:  Emergency department.  -------------------------------------------------------------------  ------------------------------------------------------------------- Left ventricle:  Distal septal hypokinesis The cavity size was normal. Systolic function was normal. The estimated ejection fraction was 55%. Wall motion was normal; there were no regional wall motion abnormalities. The transmitral flow pattern was normal. The deceleration time of the early transmitral flow velocity was normal. The pulmonary vein flow pattern was normal. The tissue Doppler parameters were normal. Left ventricular diastolic function parameters were normal.  ------------------------------------------------------------------- Aortic valve:   Trileaflet; normal thickness leaflets. Mobility was not restricted.  Doppler:  Transvalvular velocity was within the normal range. There was no stenosis. There was no regurgitation.  ------------------------------------------------------------------- Aorta:  The aorta was normal, not dilated, and non-diseased. Aortic root: The aortic root was normal in size.  ------------------------------------------------------------------- Mitral valve:   Structurally normal valve.   Mobility was not restricted.  Doppler:  Transvalvular velocity was within the normal range. There was no evidence for stenosis. There was mild regurgitation.    Peak gradient (D): 4 mm Hg.  ------------------------------------------------------------------- Left atrium:  The atrium was normal in size.  ------------------------------------------------------------------- Atrial septum:  Poorly visualized.  ------------------------------------------------------------------- Right ventricle:  The cavity size was normal. Wall thickness was normal. Systolic function was normal.  ------------------------------------------------------------------- Pulmonic valve:    Doppler:   Transvalvular velocity was within the normal range. There was no evidence for stenosis. There was trivial regurgitation.  ------------------------------------------------------------------- Tricuspid valve:   Structurally normal valve.    Doppler: Transvalvular velocity was within the normal range. There was mild regurgitation.  ------------------------------------------------------------------- Pulmonary artery:   The main pulmonary artery was normal-sized. Systolic pressure was within the normal range.  ------------------------------------------------------------------- Right atrium:  The atrium was normal in size.  ------------------------------------------------------------------- Pericardium:  The pericardium was normal in appearance. There was no pericardial effusion.  ------------------------------------------------------------------- Systemic veins: Inferior vena cava: The vessel was normal in size. The respirophasic diameter changes were in the normal range (>= 50%), consistent with normal central venous pressure.  ------------------------------------------------------------------- Post procedure conclusions Ascending Aorta:  - The aorta was normal, not dilated, and non-diseased.  ------------------------------------------------------------------- Measurements  Left ventricle                           Value        Reference LV ID, ED, PLAX chordal                  50    mm     43 - 52 LV ID, ES, PLAX chordal                  35.3  mm     23 - 38  LV fx shortening, PLAX chordal           29    %      >=29 LV PW thickness, ED                      7.87  mm     ---------- IVS/LV PW ratio, ED                      1.05         <=1.3 Stroke volume, 2D                        59    ml     ---------- Stroke volume/bsa, 2D                    33    ml/m^2 ---------- LV ejection fraction, 1-p A4C            46    %      ---------- LV end-diastolic volume, 2-p             127    ml     ---------- LV end-systolic volume, 2-p              68    ml     ---------- LV ejection fraction, 2-p                47    %      ---------- Stroke volume, 2-p                       59    ml     ---------- LV end-diastolic volume/bsa, 2-p         71    ml/m^2 ---------- LV end-systolic volume/bsa, 2-p          38    ml/m^2 ---------- Stroke volume/bsa, 2-p                   33    ml/m^2 ---------- LV e&', lateral                           8.81  cm/s   ---------- LV E/e&', lateral                         11.24        ---------- LV e&', medial                            7.07  cm/s   ---------- LV E/e&', medial                          14           ---------- LV e&', average                           7.94  cm/s   ---------- LV E/e&', average                         12.47        ----------  Ventricular septum  Value        Reference IVS thickness, ED                        8.27  mm     ----------  LVOT                                     Value        Reference LVOT ID, S                               20    mm     ---------- LVOT area                                3.14  cm^2   ---------- LVOT peak velocity, S                    92.5  cm/s   ---------- LVOT mean velocity, S                    58.4  cm/s   ---------- LVOT VTI, S                              18.9  cm     ----------  Aorta                                    Value        Reference Aortic root ID, ED                       34    mm     ----------  Left atrium                              Value        Reference LA ID, A-P, ES                           26    mm     ---------- LA ID/bsa, A-P                           1.45  cm/m^2 <=2.2 LA volume, S                             58.4  ml     ---------- LA volume/bsa, S                         32.5  ml/m^2 ---------- LA volume, ES, 1-p A4C                   54    ml     ---------- LA volume/bsa, ES, 1-p A4C               30    ml/m^2 ---------- LA volume,  ES, 1-p A2C  62.2  ml     ---------- LA volume/bsa, ES, 1-p A2C               34.6  ml/m^2 ----------  Mitral valve                             Value        Reference Mitral E-wave peak velocity              99    cm/s   ---------- Mitral A-wave peak velocity              76.6  cm/s   ---------- Mitral deceleration time                 177   ms     150 - 230 Mitral peak gradient, D                  4     mm Hg  ---------- Mitral E/A ratio, peak                   1.3          ----------  Pulmonary arteries                       Value        Reference PA pressure, S, DP                       27    mm Hg  <=30  Tricuspid valve                          Value        Reference Tricuspid regurg peak velocity           244   cm/s   ---------- Tricuspid peak RV-RA gradient            24    mm Hg  ----------  Right atrium                             Value        Reference RA ID, S-I, ES, A4C                      47.1  mm     34 - 49 RA area, ES, A4C                         15    cm^2   8.3 - 19.5 RA volume, ES, A/L                       40.4  ml     ---------- RA volume/bsa, ES, A/L                   22.5  ml/m^2 ----------  Systemic veins                           Value        Reference Estimated CVP  3     mm Hg  ----------  Right ventricle                          Value        Reference TAPSE                                    22.1  mm     ---------- RV pressure, S, DP                       27    mm Hg  <=30 RV s&', lateral, S                        10.2  cm/s   ----------  Legend: (L)  and  (H)  mark values outside specified reference range.  ------------------------------------------------------------------- Prepared and Electronically Authenticated by  Charlton Haws, M.D. 2018-01-05T12:10:35              EKG:  EKG is  ordered today.  The ekg ordered today demonstrates NSR 65 bpm with LAFB and TWI lead V3.   Recent Labs: No results  found for requested labs within last 365 days.  Recent Lipid Panel    Component Value Date/Time   CHOL 224 (H) 11/03/2017 0827   TRIG 446 (H) 11/03/2017 0827   HDL 40 11/03/2017 0827   CHOLHDL 5.6 (H) 11/03/2017 0827   CHOLHDL 6.8 01/23/2015 1025   VLDL UNABLE TO CALCULATE IF TRIGLYCERIDE OVER 400 mg/dL 16/11/9602 5409   LDLCALC Comment 11/03/2017 0827    Risk Assessment/Calculations:     Home Medications   Current Meds  Medication Sig   aspirin 81 MG chewable tablet Chew 1 tablet (81 mg total) by mouth daily.   chlorthalidone (HYGROTON) 25 MG tablet TAKE 1/2 TABLET BY MOUTH EVERY DAY   clopidogrel (PLAVIX) 75 MG tablet Take 1 tablet (75 mg total) by mouth daily. NEED OV.   empagliflozin (JARDIANCE) 10 MG TABS tablet Take 10 mg by mouth daily.   enalapril (VASOTEC) 20 MG tablet TAKE 1 TABLET BY MOUTH TWICE A DAY   HYDROcodone-acetaminophen (NORCO) 7.5-325 MG tablet Take 1 tablet by mouth every 8 (eight) hours as needed.   lansoprazole (PREVACID) 30 MG capsule Take 30 mg by mouth daily.     metFORMIN (GLUCOPHAGE-XR) 500 MG 24 hr tablet Take 500 mg by mouth in the morning and at bedtime.    methocarbamol (ROBAXIN) 500 MG tablet Take 500 mg by mouth every 8 (eight) hours as needed for muscle spasms.    nitroGLYCERIN (NITROSTAT) 0.3 MG SL tablet Place 1 tablet (0.3 mg total) under the tongue every 5 (five) minutes as needed for chest pain.   rosuvastatin (CRESTOR) 40 MG tablet TAKE 1 TABLET BY MOUTH EVERY DAY     Review of Systems      All other systems reviewed and are otherwise negative except as noted above.  Physical Exam    VS:  BP 107/71 (BP Location: Left Arm, Patient Position: Sitting, Cuff Size: Normal)   Pulse 65   Ht 5\' 5"  (1.651 m)   Wt 166 lb 8 oz (75.5 kg)   SpO2 98%   BMI 27.71 kg/m  , BMI Body mass index is 27.71 kg/m.  Wt Readings from Last 3 Encounters:  07/25/22 166 lb 8  oz (75.5 kg)  03/05/21 164 lb (74.4 kg)  12/17/19 152 lb 3.2 oz (69 kg)      GEN: Well nourished, overweight, well developed, in no acute distress. HEENT: normal. Neck: Supple, no JVD, carotid bruits, or masses. Cardiac: RRR, no murmurs, rubs, or gallops. No clubbing, cyanosis, edema.  Radials/PT 2+ and equal bilaterally.  Respiratory:  Respirations regular and unlabored, clear to auscultation bilaterally. GI: Soft, nontender, nondistended. MS: No deformity or atrophy. Skin: Warm and dry, no rash. Neuro:  Strength and sensation are intact. Psych: Normal affect.  Assessment & Plan    Palpitations - Brief episodes likely related to stress. Too infrequent to warrant ZIO at this time. Rx diltiazem 30mg  PRN for palpitations. Hesitant to increase daily AV nodal blocking agent due to HR in the 60s. Discussed to manage stress well, avoid caffeine, stay well hydrated.   CAD - s/p multiple interventions to LCx and RCA with most recent [facility] 02/2016. Discussed exertional dyspnea likely related to deconditioning, heat. He will contact us if it progresses. GDMT aspirin, plavix, rosuvastatin, toprol. Dicsussed possible cardiac PET. As only isolated episode of chest pain, prefers to monitor symptoms at this time. Heart healthy diet and regular cardiovascular exercise encouraged.    HTN - BP relatively hypotensive but asymptomatic with no lightheadedness, dizziness. Increase Chlorthalidone from 12.5mg  to 25mg  QD as he has difficulty splitting pill inhalf. Continue Toprol 200mg  QD. Discussed to monitor BP at home at least 2 hours after medications and sitting for 5-10 minutes. Check in via MyChart in 1-2 weeks, if tolerating higher dose Chlorthalidone will order BMP at that time.   HLD / Hypertriglyceridemia - Continue Rosuvastatin 40mg  QD.   Chronic systolic CHF / ICM - 2018 LVEF55%. Euvolemic and well compensated on exam. GDMT Toprol, Jardiance. No indication for loop diuretic. BP would not tolerate ACE/ARB/ARNI. Low sodium diet, fluid restriction <2L, and daily weights encouraged.  Educated to contact our office for weight gain of 2 lbs overnight or 5 lbs in one week.   DM2 - Continue to follow with PCP.          Disposition: Follow up in 3 month(s) with Rollene Rotunda, MD or APP.  Signed, Alver Sorrow, NP 07/25/2022, 8:37 AM [facility]

## 2022-07-25 NOTE — Patient Instructions (Addendum)
Medication Instructions:  Your physician has recommended you make the following change in your medication:   Increase: Chlorthalidone to a whole tablet  Start: Diltiazem 30mg  tablet as needed for palpitations   We have sent refills of Nitroglycerin and plavix today  *If you need a refill on your cardiac medications before your next appointment, please call your pharmacy*  Testing/Procedures: Just send [place] a mychart message or call us if you decide you want to have the Cardiac Pet Scan done.    Follow-Up: At [facility], you and your health needs are our priority.  As part of our continuing mission to provide you with exceptional heart care, we have created designated Provider Care Teams.  These Care Teams include your primary Cardiologist (physician) and Advanced Practice Providers (APPs -  Physician Assistants and Nurse Practitioners) who all work together to provide you with the care you need, when you need it.  We recommend signing up for the patient portal called "MyChart".  Sign up information is provided on this After Visit Summary.  MyChart is used to connect with patients for Virtual Visits (Telemedicine).  Patients are able to view lab/test results, encounter notes, upcoming appointments, etc.  Non-urgent messages can be sent to your provider as well.   To learn more about what you can do with MyChart, go to [email or web address].    Your next appointment:   3 month(s)  Provider:   Dr. Antoine Poche    Other Instructions To prevent palpitations: Make sure you are adequately hydrated.  Avoid and/or limit caffeine containing beverages like soda or tea. Exercise regularly.  Manage stress well. Some over the counter medications can cause palpitations such as Benadryl, AdvilPM, TylenolPM. Regular Advil or Tylenol do not cause palpitations.

## 2022-07-28 ENCOUNTER — Encounter (HOSPITAL_BASED_OUTPATIENT_CLINIC_OR_DEPARTMENT_OTHER): Payer: Self-pay | Admitting: Family

## 2022-08-02 ENCOUNTER — Other Ambulatory Visit (HOSPITAL_BASED_OUTPATIENT_CLINIC_OR_DEPARTMENT_OTHER): Payer: Self-pay | Admitting: Family

## 2022-08-02 DIAGNOSIS — R002 Palpitations: Secondary | ICD-10-CM

## 2022-08-08 ENCOUNTER — Encounter (HOSPITAL_BASED_OUTPATIENT_CLINIC_OR_DEPARTMENT_OTHER): Payer: Self-pay

## 2022-08-08 DIAGNOSIS — I10 Essential (primary) hypertension: Secondary | ICD-10-CM

## 2022-08-19 ENCOUNTER — Ambulatory Visit: Payer: BC Managed Care – PPO | Admitting: Adult Health

## 2022-08-22 DIAGNOSIS — Z6827 Body mass index (BMI) 27.0-27.9, adult: Secondary | ICD-10-CM | POA: Diagnosis not present

## 2022-08-22 DIAGNOSIS — Z1331 Encounter for screening for depression: Secondary | ICD-10-CM | POA: Diagnosis not present

## 2022-08-22 DIAGNOSIS — E1165 Type 2 diabetes mellitus with hyperglycemia: Secondary | ICD-10-CM | POA: Diagnosis not present

## 2022-08-22 DIAGNOSIS — Z1339 Encounter for screening examination for other mental health and behavioral disorders: Secondary | ICD-10-CM | POA: Diagnosis not present

## 2022-08-25 MED ORDER — METOPROLOL SUCCINATE ER 200 MG PO TB24: 200.0000 mg | ORAL_TABLET | Freq: Every day | ORAL | 3 refills | Status: DC

## 2022-08-25 MED ORDER — CHLORTHALIDONE 25 MG PO TABS
25.0000 mg | ORAL_TABLET | Freq: Every day | ORAL | 3 refills | Status: DC
Start: 2022-08-25 — End: 2022-09-26

## 2022-09-25 ENCOUNTER — Other Ambulatory Visit: Payer: Self-pay | Admitting: Cardiology

## 2022-09-25 DIAGNOSIS — I10 Essential (primary) hypertension: Secondary | ICD-10-CM

## 2022-10-20 ENCOUNTER — Encounter (HOSPITAL_BASED_OUTPATIENT_CLINIC_OR_DEPARTMENT_OTHER): Payer: Self-pay

## 2022-10-20 MED ORDER — ROSUVASTATIN CALCIUM 40 MG PO TABS: 40.0000 mg | ORAL_TABLET | Freq: Every day | ORAL | 3 refills | Status: DC

## 2022-10-31 DIAGNOSIS — R002 Palpitations: Secondary | ICD-10-CM | POA: Insufficient documentation

## 2022-10-31 HISTORY — DX: Palpitations: R00.2

## 2022-10-31 NOTE — Progress Notes (Unsigned)
Cardiology Office Note:   Date:  11/02/2022  ID:  [name] [name], DOB 1960-10-26, MRN [id number] PCP: Noni Saupe, MD  [facility] Providers Cardiologist:  Rollene Rotunda, MD {  History of Present Illness:   [name] [name] is a 62 y.o. male who presents for followup after recent hospitalization for follow up of CAD.  He has a history of multiple coronary interventions.  He was in the hospital in Jan 2018 with moderate 80% diagonal stenosis, proximal 95% circumflex stenosis before the stented area and 95% stenosis just beyond the stent area.  He had patent stents in the right coronary with 60-70% stenosis. He underwent DES stenting of his proximal circumflex and DES stenting of his mid circumflex.  He was in the hospital again in early January of 2018. He required PCI with drug-eluting stents 3 placed in the right coronary. His EF was 45% by LV gram. It was normal by echo with mild MR 2018.     He presents for follow up.  Since I last saw him he has had the stress of his mom having Alzheimer's.  He is also had his blood sugar go out of control and he reports his A1c to be 10.  He is just getting this managed. The patient denies any new symptoms such as chest discomfort, neck or arm discomfort. There has been no new shortness of breath, PND or orthopnea. There have been no reported palpitations, presyncope or syncope.  He walks his little dog.  ROS: Positive for panic attacks.  Otherwise as stated in the HPI and negative for all other systems.  Studies Reviewed:    EKG:   Sinus rhythm, rate 65, axis within normal limits, intervals within normal limits, early transition lead V2, T wave inversions in the anterolateral leads.  No significant change from previous.  Risk Assessment/Calculations:              Physical Exam:   VS:  BP (!) 103/58 (BP Location: Left Arm, Patient Position: Sitting, Cuff Size: Normal) Comment: 100/56  Pulse 73   Ht 5' 4.5" (1.638 m)   Wt 152 lb 3.2 oz (69  kg)   SpO2 98%   BMI 25.72 kg/m    Wt Readings from Last 3 Encounters:  11/02/22 152 lb 3.2 oz (69 kg)  07/25/22 166 lb 8 oz (75.5 kg)  03/05/21 164 lb (74.4 kg)     GEN: Well nourished, well developed in no acute distress NECK: No JVD; No carotid bruits CARDIAC: RRR, no murmurs, rubs, gallops RESPIRATORY:  Clear to auscultation without rales, wheezing or rhonchi  ABDOMEN: Soft, non-tender, non-distended EXTREMITIES:  No edema; No deformity   ASSESSMENT AND PLAN:   Palpitations -He has occasional palpitations but these are not particularly problematic.  He has not had to use as needed Cardizem.  No change in therapy.  CAD -   He is status post multiple interventions to LCx and RCA with most recent [facility] 02/2016. The patient denies any new symptoms such as chest discomfort, neck or arm discomfort. There has been no new shortness of breath, PND or orthopnea. There have been no reported palpitations, presyncope or syncope.    HTN - BP is at target.  No change in therapy.  He tolerates this slightly low blood pressure.   HLD / Hypertriglyceridemia -   He we will get me his most recent labs.  I do not have access to these.  Goal LDL should be in the 50s.  Chronic systolic CHF / ICM - 2018 LVEF  55%.  He seems to be euvolemic.  No change in therapy.  DM2 -   As mentioned he is just getting his blood sugar back under control and is followed by his primary provider.  Continue to follow with PCP.        Follow up with me in 12 months.   Signed, Rollene Rotunda, MD

## 2022-11-02 ENCOUNTER — Ambulatory Visit: Payer: BC Managed Care – PPO | Attending: Cardiology | Admitting: Cardiology

## 2022-11-02 ENCOUNTER — Encounter: Payer: Self-pay | Admitting: Cardiology

## 2022-11-02 VITALS — BP 103/58 | HR 73 | Ht 64.5 in | Wt 152.2 lb

## 2022-11-02 DIAGNOSIS — E785 Hyperlipidemia, unspecified: Secondary | ICD-10-CM | POA: Diagnosis not present

## 2022-11-02 DIAGNOSIS — I10 Essential (primary) hypertension: Secondary | ICD-10-CM

## 2022-11-02 DIAGNOSIS — I5022 Chronic systolic (congestive) heart failure: Secondary | ICD-10-CM

## 2022-11-02 DIAGNOSIS — R002 Palpitations: Secondary | ICD-10-CM

## 2022-11-02 DIAGNOSIS — E118 Type 2 diabetes mellitus with unspecified complications: Secondary | ICD-10-CM

## 2022-11-02 NOTE — Patient Instructions (Addendum)
Medication Instructions:  NO CHANGES  *If you need a refill on your cardiac medications before your next appointment, please call your pharmacy*  Follow-Up: At [facility], you and your health needs are our priority.  As part of our continuing mission to provide you with exceptional heart care, we have created designated Provider Care Teams.  These Care Teams include your primary Cardiologist (physician) and Advanced Practice Providers (APPs -  Physician Assistants and Nurse Practitioners) who all work together to provide you with the care you need, when you need it.  We recommend signing up for the patient portal called "MyChart".  Sign up information is provided on this After Visit Summary.  MyChart is used to connect with patients for Virtual Visits (Telemedicine).  Patients are able to view lab/test results, encounter notes, upcoming appointments, etc.  Non-urgent messages can be sent to your provider as well.   To learn more about what you can do with MyChart, go to [email or web address].    Your next appointment:    12 months with Dr. Hochrein 

## 2023-07-05 ENCOUNTER — Other Ambulatory Visit (HOSPITAL_BASED_OUTPATIENT_CLINIC_OR_DEPARTMENT_OTHER): Payer: Self-pay | Admitting: Family

## 2023-07-28 DIAGNOSIS — F410 Panic disorder [episodic paroxysmal anxiety] without agoraphobia: Secondary | ICD-10-CM | POA: Diagnosis not present

## 2023-07-28 DIAGNOSIS — F419 Anxiety disorder, unspecified: Secondary | ICD-10-CM | POA: Diagnosis not present

## 2023-08-10 DIAGNOSIS — Z113 Encounter for screening for infections with a predominantly sexual mode of transmission: Secondary | ICD-10-CM | POA: Diagnosis not present

## 2023-08-10 DIAGNOSIS — Z0000 Encounter for general adult medical examination without abnormal findings: Secondary | ICD-10-CM | POA: Diagnosis not present

## 2023-08-10 DIAGNOSIS — E1165 Type 2 diabetes mellitus with hyperglycemia: Secondary | ICD-10-CM | POA: Diagnosis not present

## 2023-08-10 DIAGNOSIS — Z1331 Encounter for screening for depression: Secondary | ICD-10-CM | POA: Diagnosis not present

## 2023-08-10 DIAGNOSIS — F419 Anxiety disorder, unspecified: Secondary | ICD-10-CM | POA: Diagnosis not present

## 2023-08-10 DIAGNOSIS — Z13220 Encounter for screening for lipoid disorders: Secondary | ICD-10-CM | POA: Diagnosis not present

## 2023-08-10 DIAGNOSIS — Z23 Encounter for immunization: Secondary | ICD-10-CM | POA: Diagnosis not present

## 2023-09-10 ENCOUNTER — Other Ambulatory Visit (HOSPITAL_BASED_OUTPATIENT_CLINIC_OR_DEPARTMENT_OTHER): Payer: Self-pay | Admitting: Family

## 2023-09-10 DIAGNOSIS — I25118 Atherosclerotic heart disease of native coronary artery with other forms of angina pectoris: Secondary | ICD-10-CM

## 2023-09-13 DIAGNOSIS — I10 Essential (primary) hypertension: Secondary | ICD-10-CM | POA: Diagnosis not present

## 2023-09-13 DIAGNOSIS — F419 Anxiety disorder, unspecified: Secondary | ICD-10-CM | POA: Diagnosis not present

## 2023-10-01 ENCOUNTER — Other Ambulatory Visit (HOSPITAL_BASED_OUTPATIENT_CLINIC_OR_DEPARTMENT_OTHER): Payer: Self-pay | Admitting: Family

## 2023-10-19 ENCOUNTER — Encounter: Payer: Self-pay | Admitting: Cardiology

## 2023-10-22 ENCOUNTER — Other Ambulatory Visit: Payer: Self-pay | Admitting: Family

## 2023-10-22 DIAGNOSIS — I10 Essential (primary) hypertension: Secondary | ICD-10-CM

## 2023-11-02 ENCOUNTER — Telehealth: Payer: Self-pay

## 2023-11-02 NOTE — Telephone Encounter (Signed)
 Tried contacting patient to schedule IN OFFICE VISIT no answer left a detailed vm to call back and schedule

## 2023-11-02 NOTE — Telephone Encounter (Signed)
   Name: Jamall Strohmeier  DOB: September 23, 1960  MRN: [id number]  Primary Cardiologist: Lynwood Schilling, MD  Chart reviewed as part of pre-operative protocol coverage. Because of Zackarie Cusic's past medical history and time since last visit, he will require a follow-up in-office visit in order to better assess preoperative cardiovascular risk.  Pre-op covering staff: - Please schedule appointment and call patient to inform them. If patient already had an upcoming appointment within acceptable timeframe, please add pre-op clearance to the appointment notes so provider is aware. - Please contact requesting surgeon's office via preferred method (i.e, phone, fax) to inform them of need for appointment prior to surgery.  Patient has not been seen in a year.  Patient has been on dual antiplatelets since 2018 when he had his last PCI.  Should be okay to hold both Plavix  and aspirin  x 5 to 7 days prior to dental work as long as no new symptoms at the time of office visit.  Resume when medically safe to do so  Orren LOISE Fabry, PA-C  11/02/2023, 3:44 PM

## 2023-11-02 NOTE — Telephone Encounter (Signed)
   Pre-operative Risk Assessment    Patient Name: [name] [name]  DOB: 27-May-1960 MRN: [id number]   Date of last office visit: 11/02/22 LYNWOOD SCHILLING, MD Date of next office visit: NONE  Request for Surgical Clearance    Procedure:  Dental Extraction - Amount of Teeth to be Pulled:  4 EXTRACTIONS, X-RAYS, PROPHYLAXIS, AND 10 FILLINGS (CALLED AND CONFIRM TREATMENT)  Date of Surgery:  Clearance 11/08/23                                Surgeon:  NOT INDICATED Surgeon's Group or Practice Name:  [facility] Phone number:  [phone] Fax number:  [phone]   Type of Clearance Requested:   - Medical  - Pharmacy:  Hold Aspirin  and Clopidogrel  (Plavix )     Type of Anesthesia:  Local    Additional requests/questions:    Signed, Lucie DELENA Ku   11/02/2023, 3:18 PM

## 2023-11-03 NOTE — Telephone Encounter (Signed)
 Patient is scheduled with DOROTHA Hoover, NP. Patient is aware it is after the procedure and he said that he will call them to rescheduled because he wants to wait a little.

## 2023-11-08 DIAGNOSIS — M5430 Sciatica, unspecified side: Secondary | ICD-10-CM | POA: Insufficient documentation

## 2023-11-08 DIAGNOSIS — I119 Hypertensive heart disease without heart failure: Secondary | ICD-10-CM | POA: Insufficient documentation

## 2023-11-08 DIAGNOSIS — G8929 Other chronic pain: Secondary | ICD-10-CM | POA: Insufficient documentation

## 2023-11-08 DIAGNOSIS — K219 Gastro-esophageal reflux disease without esophagitis: Secondary | ICD-10-CM | POA: Insufficient documentation

## 2023-11-08 DIAGNOSIS — G4730 Sleep apnea, unspecified: Secondary | ICD-10-CM | POA: Insufficient documentation

## 2023-11-08 DIAGNOSIS — M419 Scoliosis, unspecified: Secondary | ICD-10-CM | POA: Insufficient documentation

## 2023-11-09 NOTE — Progress Notes (Unsigned)
 Cardiology Office Note   Date:  11/10/2023  ID:  [name] [name], DOB 08-19-1960, MRN [id number] PCP: Conley Dene BROCKS, DO  [facility] Providers Cardiologist:  Lynwood Schilling, MD     History of Present Illness [name] [name] is a 63 y.o. male with a past medical history of HFrEF, CAD with multiple interventions, hypertension, GERD, DM 2, dyslipidemia.  02/19/2016 left heart cath PCI with DES x 3 RCA, widely patent previously placed stent in the circumflex 02/19/2016 echo EF 55%, no RWMA, mild MR 01/23/2015 cardiac cath triple-vessel CAD, PTCA DES x 1 to proximal circumflex, PTCA/DES x 1 to mid circumflex 2000 bare-metal stent to the RCA and prior stenting to the circumflex  Longstanding patient of Dr. Schilling for management of his CAD, it appears his initial stents were placed in 2000 to the RCA and to the circumflex.  In 2016 he underwent left heart cath revealing triple vessel CAD, PTCA with DES x 1 to proximal circumflex and PTCA with DES x 1 to mid circumflex.  He had previously had echocardiograms revealing HFrEF, most recent echo in 2018 revealed improvement of his EF to 55%.  In 2018 he also underwent left heart cath with DES to his RCA.  He was most recently evaluated by Dr. Schilling on 11/02/2022, he had apparently been newly diagnosed with diabetes and tried to get this under control, he was not having any anginal complaints, no changes were made to his medications or plan of care and he was advised to follow-up in 1 year.  He presents today for preoperative evaluation for upcoming dental procedures, has been doing well since he was last evaluated in our office, no formal complaints from a cardiac perspective.  He has gotten his diabetes under control with his most recent A1c 5.8%.  He does walk for exercise, does this without any anginal complaints.  Follows closely with his PCP. He denies chest pain, palpitations, dyspnea, pnd, orthopnea, n, v, dizziness, syncope, edema, weight gain,  or early satiety.    ROS: Review of Systems  All other systems reviewed and are negative.    Studies Reviewed      Cardiac Studies & Procedures   ______________________________________________________________________________________________ CARDIAC CATHETERIZATION  CARDIAC CATHETERIZATION 02/19/2016  Conclusion Images from the original result were not included.   Mid RCA-1 lesion, 95 %stenosed - proximal stent edge. Mid RCA-2 stented segment, 75 % in-stent re- stenosed Followed by 65% distal to crux..  Dist RCA-2 lesion, 95 %stenosed - focal lesion at the takeoff of small tandem PDA.  Prox LAD lesion, 40 %stenosed. Ost 1st Diag to 1st Diag lesion, 85 %stenosed. Stable from previous catheterizations  Ost Cx to Prox Cx recently placed Synergy DES stent 3.0 mm x 12 mm overlapping prior stent Cypher stent, 0 %stenosed.  Ost 1st Mrg to 1st Mrg extensive stented segment with previous Cypher DES 2.5 mm x 28 mm overlap distally with recently placed Synergy 2.5 x 12 mm stent., 0 %stenosed.  Dist RCA-1 lesion, 40 %stenosed. In between mid RCA lesion and distal severe lesion.  The left ventricular ejection fraction is 45-50% by visual estimate.  LV end diastolic pressure is mildly elevated.  --- CULPRIT LESIONS ----  --- LESION #1 Dist RCA-2 lesion, 95 %stenosed - focal lesion at the takeoff of small tandem PDA.  A STENT SYNERGY DES [id number] drug eluting stent was successfully placed, and does not overlap previously placed stent.  Post intervention, there is a 0% residual stenosis.  --- LESIONS #2 - Mid  RCA-1 lesion, 95 %stenosed - proximal stent edge. Mid RCA-2 stented segment, 75 % in-stent re- stenosed Followed by 65% distal to crux..  A 2nd STENT SYNERGY DES [id number] drug eluting stent was successfully placed (postdilated to 3.6 m), and overlaps previously placed stented segment and extends distal. .  A STENT SYNERGY DES 3.5X12 drug-eluting stent was successfully placed covering the  proximal edge of the previously placed stent overlapping the new Synergy DES into the more proximal RCA.. - Postdilated to 3.7 mm  Post intervention, there is a 0% residual stenosis.  Successful DES PCI to extensive segments in the RCA including significant in-stent restenosis segments as well as the notable lesions in the distal RCA. Widely patent previously placed stents in the Circumflex-OM - most recently from December 2016  Plan:  Transfer to 6 Central post procedure unit for TR band removal and post PCI care.  Expected discharge tomorrow.  Continue aspirin  plus Plavix  indefinitely.  Continue aggressive cardiac risk factor modification.  He will follow-up with Dr. Lavona Alm Clay, M.D., M.S. Interventional Cardiologist  Pager # [phone] Phone # [phone] [address] [place], [place] [address]  Findings Coronary Findings Diagnostic  Dominance: Right  Left Main Vessel is large.  Left Anterior Descending The vessel exhibits minimal luminal irregularities. The lesion is segmental, tubular and smooth.  First Diagonal Branch The lesion is discrete. The lesion is calcified. Similar to several prior reports and angiographic images.  First Septal Branch Vessel is small in size. Vessel is angiographically normal.  Second Diagonal Branch Vessel is small in size. Vessel is angiographically normal.  Second Septal Branch Vessel is small in size. Vessel is angiographically normal.  Third Septal Branch Vessel is small in size.  Ramus Intermedius Vessel is small. Vessel is angiographically normal.  Left Circumflex Vessel is large. Previously placed Ost Cx to Prox Cx drug eluting stent is widely patent. Overlapped prior Cypher DES 01/2015  First Obtuse Marginal Branch Vessel is large in size. Previously placed Ost 1st Mrg to 1st Mrg drug eluting stent is widely patent. Initial BMS 2000, then 2007, 2014 & 2016  Lateral First Obtuse  Marginal Branch Vessel is small in size.  Right Coronary Artery Vessel is large.  The lesion is segmental and irregular. Several focal areas of in-stent restenosis. This continues at 65% distal to the stent until the crux. The lesion was previously treated using a drug eluting stent over 2 years ago. Previously placed stent displays restenosis. The lesion continues distal to the stent to the crux The lesion is discrete, tubular and smooth. The lesion is located at the major branch and discrete. At the first small tandem PDA  Acute Marginal Branch Vessel is small in size.  Right Posterior Descending Artery Vessel is moderate in size. The vessel exhibits minimal luminal irregularities.  Inferior Septal Vessel is small in size. Courses of the tandem PDA  Right Posterior Atrioventricular Artery Vessel is moderate in size.  First Right Posterolateral Branch Vessel is small in size. The vessel exhibits minimal luminal irregularities.  Second Right Posterolateral Branch Vessel is small in size. The vessel exhibits minimal luminal irregularities.  Intervention  Mid RCA-1 lesion Angioplasty (Also treats lesions: Mid RCA-2) Lesion length: 48 mm. Lesion crossed with guidewire using a WIRE RUNTHROUGH .[id number]. Pre-stent angioplasty was performed using a BALLOON ANGIOSCULPT RX 3.0X15. Maximum pressure: 12 atm. Inflation time: 20 sec. Initial predilation: BALLOON EMERGE MR 2.5X12 (31058) - several inflations to 12 atm for 20 seconds A  STENT SYNERGY DES [id number] drug eluting stent was successfully placed, and overlaps previously placed stent. Minimum lumen area: 3.6 mm. Stent strut is well apposed. Post-stent angioplasty was performed using a BALLOON [place] MOZEC 3.5X15. Maximum pressure: 18 atm. Inflation time: 30 sec. Pre-stent angioplasty was performed using a BALLOON ANGIOSCULPT RX 3.0X15. Maximum pressure: 14 atm. Inflation time: 30 sec. A STENT SYNERGY DES 3.5X12 drug-eluting stent was successfully  placed. Minimum lumen area: 3.7 mm. Stent strut is well apposed. Stent 2 overlaps at the proximal edge. Post-stent angioplasty was performed using a BALLOON [place] MOZEC 3.5X15. Maximum pressure: 20 atm. Inflation time: 30 sec. The pre-interventional distal flow is normal (TIMI 3).  The post-interventional distal flow is normal (TIMI 3). The intervention was successful . No complications occurred at this lesion. CATH VISTA GUIDE 6FR JR4 ([id number]) There is a 0% residual stenosis post intervention.  Mid RCA-2 lesion Angioplasty (Also treats lesions: Mid RCA-1) See details in Mid RCA-1 lesion. There is a 0% residual stenosis post intervention.  Dist RCA-2 lesion Angioplasty Lesion length: 10 mm. Lesion crossed with guidewire using a WIRE RUNTHROUGH .[id number]. Pre-stent angioplasty was performed using a BALLOON EMERGE MR 2.5X12. Maximum pressure: 12 atm. Inflation time: 20 sec. A STENT SYNERGY DES [id number] drug eluting stent was successfully placed, and does not overlap previously placed stent. Minimum lumen area: 2.9 mm. Stent strut is well apposed. Post-stent angioplasty was performed using a STENT SYNERGY DES [id number]. Maximum pressure: 20 atm. Inflation time: 30 sec. With stent balloon to high atmospheres The pre-interventional distal flow is normal (TIMI 3).  The post-interventional distal flow is normal (TIMI 3). The intervention was successful . No complications occurred at this lesion. CATH VISTA GUIDE 6FR JR4 ([id number]) There is a 0% residual stenosis post intervention.   CARDIAC CATHETERIZATION  CARDIAC CATHETERIZATION 01/23/2015  Conclusion 1. Triple vessel CAD. 2. The LAD has moderate diffuse disease proximally, none of which appears to be flow limiting. There is a moderate caliber diagonal branch with ostial 80% stenosis. Based on prior caths, this has not changed and is not felt to be responsible for his change in clinical symptoms. 3. The Circumflex terminates into an OM branch. The  proximal vessel has a 95% stenosis just before the stented segment. There is diffuse 30% restenosis in the stented segment (2 layers of stents). Just beyond the stent there is a focal 95% stenosis in the mid vessel. 4. The RCA is a large dominant vessel with a mid stented segment (2 layers of stents, most recently Cypher DES in 2007). The stented segment has diffuse 60-70% restenosis. None of these lesions are severe. 5. Mild LV systolic dysfunction 6. Successful PTCA/DES x 1 proximal Circumflex 7. Successful PTCA/DES x 1 mid Circumflex.  Recommendations: Continue ASA and Plavix  for lifetime. If he has recurrent chest pain, consider FFR of RCA although treatment of the stented segment would likely neccesitate a third layer of stent. The Diagonal branch has been diseased for years and described as such in cath reports dating back to 2005.  Findings Coronary Findings Diagnostic  Dominance: Right  Left Anterior Descending Diffuse.  First Personnel officer.  Second Diagonal Branch The vessel is small in size.  Left Circumflex . Vessel is moderate in size. Discrete. Diffuse. The lesion was previously treated with a drug-eluting stent greater than two years ago. Discrete.  Right Coronary Artery Discrete. Diffuse. The lesion was previously treated with a drug-eluting stent greater than two years ago. Discrete. Discrete.  Intervention  Mid Cx-1  lesion PCI The pre-interventional distal flow is normal (TIMI 3). Pre-stent angioplasty was performed. A drug-eluting stent was placed. The strut is apposed. Post-stent angioplasty was performed. The post-interventional distal flow is normal (TIMI 3). The intervention was successful. No complications occurred at this lesion. There is a 0% residual stenosis post intervention.  Dist Cx lesion PCI The pre-interventional distal flow is normal (TIMI 3). Pre-stent angioplasty was performed. A drug-eluting stent was placed. The strut is  apposed. Post-stent angioplasty was performed. The post-interventional distal flow is normal (TIMI 3). The intervention was successful. No complications occurred at this lesion. There is a 0% residual stenosis post intervention.     ECHOCARDIOGRAM  ECHOCARDIOGRAM COMPLETE 02/19/2016  Narrative *[place]* *[facility]* [address] [place], [place] [address] [phone]  ------------------------------------------------------------------- Transthoracic Echocardiography  Patient:    Everrett, Lacasse MR #:       [id number] Study Date: 02/19/2016 Gender:     M Age:        55 Height:     162.6 cm Weight:     70.3 kg BSA:        1.8 m^2 Pt. Status: Room:       3W14C  ADMITTING    Alm Clay, MD PERFORMING   [facility], Inpatient SONOGRAPHER  Thersia Lander, RDCS ATTENDING    Charlott Francois H ORDERING     Charlott Francois H REFERRING    [place], [place] H  cc:  ------------------------------------------------------------------- LV EF: 55%  ------------------------------------------------------------------- Indications:      Chest pain 786.51.  ------------------------------------------------------------------- History:   PMH:  Hypertensive Heart Disease.  Coronary artery disease.  Congestive heart failure.  ------------------------------------------------------------------- Study Conclusions  - Left ventricle: Distal septal hypokinesis The cavity size was normal. Systolic function was normal. The estimated ejection fraction was 55%. Wall motion was normal; there were no regional wall motion abnormalities. Left ventricular diastolic function parameters were normal. - Mitral valve: There was mild regurgitation.  ------------------------------------------------------------------- Study data:  Comparison was made to the study of 04/29/2012.  Study status:  Routine.  Procedure:  The patient reported no pain pre or post test. Transthoracic echocardiography.  Image quality was adequate.  Study completion:  There were no complications. Transthoracic echocardiography.  M-mode, complete 2D, spectral Doppler, and color Doppler.  Birthdate:  Patient birthdate: 07-07-1960.  Age:  Patient is 63 yr old.  Sex:  Gender: male. BMI: 26.6 kg/m^2.  Blood pressure:     111/69  Patient status: Inpatient.  Study date:  Study date: 02/19/2016. Study time: 11:07 AM.  Location:  Emergency department.  -------------------------------------------------------------------  ------------------------------------------------------------------- Left ventricle:  Distal septal hypokinesis The cavity size was normal. Systolic function was normal. The estimated ejection fraction was 55%. Wall motion was normal; there were no regional wall motion abnormalities. The transmitral flow pattern was normal. The deceleration time of the early transmitral flow velocity was normal. The pulmonary vein flow pattern was normal. The tissue Doppler parameters were normal. Left ventricular diastolic function parameters were normal.  ------------------------------------------------------------------- Aortic valve:   Trileaflet; normal thickness leaflets. Mobility was not restricted.  Doppler:  Transvalvular velocity was within the normal range. There was no stenosis. There was no regurgitation.  ------------------------------------------------------------------- Aorta:  The aorta was normal, not dilated, and non-diseased. Aortic root: The aortic root was normal in size.  ------------------------------------------------------------------- Mitral valve:   Structurally normal valve.   Mobility was not restricted.  Doppler:  Transvalvular velocity was within the normal range. There was no evidence for stenosis. There was mild  regurgitation.    Peak gradient (D): 4 mm Hg.  ------------------------------------------------------------------- Left atrium:  The atrium was normal in  size.  ------------------------------------------------------------------- Atrial septum:  Poorly visualized.  ------------------------------------------------------------------- Right ventricle:  The cavity size was normal. Wall thickness was normal. Systolic function was normal.  ------------------------------------------------------------------- Pulmonic valve:    Doppler:  Transvalvular velocity was within the normal range. There was no evidence for stenosis. There was trivial regurgitation.  ------------------------------------------------------------------- Tricuspid valve:   Structurally normal valve.    Doppler: Transvalvular velocity was within the normal range. There was mild regurgitation.  ------------------------------------------------------------------- Pulmonary artery:   The main pulmonary artery was normal-sized. Systolic pressure was within the normal range.  ------------------------------------------------------------------- Right atrium:  The atrium was normal in size.  ------------------------------------------------------------------- Pericardium:  The pericardium was normal in appearance. There was no pericardial effusion.  ------------------------------------------------------------------- Systemic veins: Inferior vena cava: The vessel was normal in size. The respirophasic diameter changes were in the normal range (>= 50%), consistent with normal central venous pressure.  ------------------------------------------------------------------- Post procedure conclusions Ascending Aorta:  - The aorta was normal, not dilated, and non-diseased.  ------------------------------------------------------------------- Measurements  Left ventricle                           Value        Reference LV ID, ED, PLAX chordal                  50    mm     43 - 52 LV ID, ES, PLAX chordal                  35.3  mm     23 - 38 LV fx shortening, PLAX chordal            29    %      >=29 LV PW thickness, ED                      7.87  mm     ---------- IVS/LV PW ratio, ED                      1.05         <=1.3 Stroke volume, 2D                        59    ml     ---------- Stroke volume/bsa, 2D                    33    ml/m^2 ---------- LV ejection fraction, 1-p A4C            46    %      ---------- LV end-diastolic volume, 2-p             127   ml     ---------- LV end-systolic volume, 2-p              68    ml     ---------- LV ejection fraction, 2-p                47    %      ---------- Stroke volume, 2-p                       59    ml     ---------- LV  end-diastolic volume/bsa, 2-p         71    ml/m^2 ---------- LV end-systolic volume/bsa, 2-p          38    ml/m^2 ---------- Stroke volume/bsa, 2-p                   33    ml/m^2 ---------- LV e&', lateral                           8.81  cm/s   ---------- LV E/e&', lateral                         11.24        ---------- LV e&', medial                            7.07  cm/s   ---------- LV E/e&', medial                          14           ---------- LV e&', average                           7.94  cm/s   ---------- LV E/e&', average                         12.47        ----------  Ventricular septum                       Value        Reference IVS thickness, ED                        8.27  mm     ----------  LVOT                                     Value        Reference LVOT ID, S                               20    mm     ---------- LVOT area                                3.14  cm^2   ---------- LVOT peak velocity, S                    92.5  cm/s   ---------- LVOT mean velocity, S                    58.4  cm/s   ---------- LVOT VTI, S                              18.9  cm     ----------  Aorta  Value        Reference Aortic root ID, ED                       34    mm     ----------  Left atrium                              Value        Reference LA ID, A-P,  ES                           26    mm     ---------- LA ID/bsa, A-P                           1.45  cm/m^2 <=2.2 LA volume, S                             58.4  ml     ---------- LA volume/bsa, S                         32.5  ml/m^2 ---------- LA volume, ES, 1-p A4C                   54    ml     ---------- LA volume/bsa, ES, 1-p A4C               30    ml/m^2 ---------- LA volume, ES, 1-p A2C                   62.2  ml     ---------- LA volume/bsa, ES, 1-p A2C               34.6  ml/m^2 ----------  Mitral valve                             Value        Reference Mitral E-wave peak velocity              99    cm/s   ---------- Mitral A-wave peak velocity              76.6  cm/s   ---------- Mitral deceleration time                 177   ms     150 - 230 Mitral peak gradient, D                  4     mm Hg  ---------- Mitral E/A ratio, peak                   1.3          ----------  Pulmonary arteries                       Value        Reference PA pressure, S, DP                       27    mm Hg  <=30  Tricuspid valve  Value        Reference Tricuspid regurg peak velocity           244   cm/s   ---------- Tricuspid peak RV-RA gradient            24    mm Hg  ----------  Right atrium                             Value        Reference RA ID, S-I, ES, A4C                      47.1  mm     34 - 49 RA area, ES, A4C                         15    cm^2   8.3 - 19.5 RA volume, ES, A/L                       40.4  ml     ---------- RA volume/bsa, ES, A/L                   22.5  ml/m^2 ----------  Systemic veins                           Value        Reference Estimated CVP                            3     mm Hg  ----------  Right ventricle                          Value        Reference TAPSE                                    22.1  mm     ---------- RV pressure, S, DP                       27    mm Hg  <=30 RV s&', lateral, S                        10.2  cm/s    ----------  Legend: (L)  and  (H)  mark values outside specified reference range.  ------------------------------------------------------------------- Prepared and Electronically Authenticated by  Maude Emmer, M.D. 2018-01-05T12:10:35          ______________________________________________________________________________________________      Risk Assessment/Calculations           Physical Exam VS:  BP 120/74   Pulse 60   Ht 5' 4.5 (1.638 m)   Wt 141 lb (64 kg)   SpO2 97%   BMI 23.83 kg/m        Wt Readings from Last 3 Encounters:  11/10/23 141 lb (64 kg)  11/02/22 152 lb 3.2 oz (69 kg)  07/25/22 166 lb 8 oz (75.5 kg)    GEN: Well nourished, well developed in no acute distress NECK: No JVD; No carotid bruits CARDIAC: RRR, no murmurs, rubs, gallops RESPIRATORY:  Clear  to auscultation without rales, wheezing or rhonchi  ABDOMEN: Soft, non-tender, non-distended EXTREMITIES:  No edema; No deformity   ASSESSMENT AND PLAN CAD - interventions per above, Stable with no anginal symptoms. No indication for ischemic evaluation.  Heart healthy diet and regular cardiovascular exercise encouraged.  Continue aspirin  81 mg daily, Plavix  75 mg daily, metoprolol  200 mg daily, nitroglycerin  as needed--has not needed, Crestor  40 mg daily.  Hypertension -his blood pressure is well-controlled today at 120/74, continue olmesartan, he is not sure of the dosage, metoprolol  200 mg daily.  DM2 -most recent A1c well-controlled at 5.8%, continue Glucophage per PCP.  Dyslipidemia -formally monitored by his PCP, with for his LDL to be less than 50.  Preoperative cardiovascular evaluation-he is able to meet greater than 4 METS of physical activity without any anginal complaints.  He is optimized from a cardiac perspective to undergo teeth extraction.  We did receive a request from a dental practice however he states he is going to be getting a few different opinions prior to deciding on which  practice he will go to.  Once he decides on the practice, he will ask them to send us  a new clearance.  Regarding his aspirin  and Plavix , he can hold these 5 to 7 days prior to dental work.       Dispo: Follow-up in 1 year.  Signed, Delon JAYSON Hoover, NP

## 2023-11-10 ENCOUNTER — Ambulatory Visit: Attending: Cardiology | Admitting: Cardiology

## 2023-11-10 ENCOUNTER — Encounter: Payer: Self-pay | Admitting: Cardiology

## 2023-11-10 VITALS — BP 120/74 | HR 60 | Ht 64.5 in | Wt 141.0 lb

## 2023-11-10 DIAGNOSIS — I10 Essential (primary) hypertension: Secondary | ICD-10-CM | POA: Diagnosis not present

## 2023-11-10 DIAGNOSIS — E118 Type 2 diabetes mellitus with unspecified complications: Secondary | ICD-10-CM | POA: Diagnosis not present

## 2023-11-10 DIAGNOSIS — E785 Hyperlipidemia, unspecified: Secondary | ICD-10-CM

## 2023-11-10 DIAGNOSIS — Z01818 Encounter for other preprocedural examination: Secondary | ICD-10-CM

## 2023-11-10 DIAGNOSIS — I25118 Atherosclerotic heart disease of native coronary artery with other forms of angina pectoris: Secondary | ICD-10-CM

## 2023-11-10 MED ORDER — NITROGLYCERIN 0.3 MG SL SUBL: 0.3000 mg | SUBLINGUAL_TABLET | SUBLINGUAL | 11 refills | Status: AC | PRN

## 2023-11-10 NOTE — Patient Instructions (Signed)
 Medication Instructions:  Your physician recommends that you continue on your current medications as directed. Please refer to the Current Medication list given to you today.  *If you need a refill on your cardiac medications before your next appointment, please call your pharmacy*  Lab Work: NONE If you have labs (blood work) drawn today and your tests are completely normal, you will receive your results only by: MyChart Message (if you have MyChart) OR A paper copy in the mail If you have any lab test that is abnormal or we need to change your treatment, we will call you to review the results.  Testing/Procedures: NONE  Follow-Up: At [facility], you and your health needs are our priority.  As part of our continuing mission to provide you with exceptional heart care, our providers are all part of one team.  This team includes your primary Cardiologist (physician) and Advanced Practice Providers or APPs (Physician Assistants and Nurse Practitioners) who all work together to provide you with the care you need, when you need it.  Your next appointment:   1 year(s)  Provider:   Dr. Lavona Hays We recommend signing up for the patient portal called MyChart.  Sign up information is provided on this After Visit Summary.  MyChart is used to connect with patients for Virtual Visits (Telemedicine).  Patients are able to view lab/test results, encounter notes, upcoming appointments, etc.  Non-urgent messages can be sent to your provider as well.   To learn more about what you can do with MyChart, go to [email or web address].   Other Instructions

## 2023-12-06 DIAGNOSIS — F419 Anxiety disorder, unspecified: Secondary | ICD-10-CM | POA: Diagnosis not present

## 2023-12-06 DIAGNOSIS — E1129 Type 2 diabetes mellitus with other diabetic kidney complication: Secondary | ICD-10-CM | POA: Diagnosis not present

## 2023-12-06 DIAGNOSIS — Z6824 Body mass index (BMI) 24.0-24.9, adult: Secondary | ICD-10-CM | POA: Diagnosis not present

## 2023-12-06 DIAGNOSIS — I10 Essential (primary) hypertension: Secondary | ICD-10-CM | POA: Diagnosis not present

## 2023-12-06 DIAGNOSIS — Z23 Encounter for immunization: Secondary | ICD-10-CM | POA: Diagnosis not present

## 2023-12-09 ENCOUNTER — Other Ambulatory Visit (HOSPITAL_BASED_OUTPATIENT_CLINIC_OR_DEPARTMENT_OTHER): Payer: Self-pay | Admitting: Family

## 2023-12-09 DIAGNOSIS — I25118 Atherosclerotic heart disease of native coronary artery with other forms of angina pectoris: Secondary | ICD-10-CM

## 2024-02-13 ENCOUNTER — Other Ambulatory Visit: Payer: Self-pay | Admitting: Family

## 2024-02-13 DIAGNOSIS — I10 Essential (primary) hypertension: Secondary | ICD-10-CM

## 2024-03-08 ENCOUNTER — Emergency Department (HOSPITAL_COMMUNITY)

## 2024-03-08 ENCOUNTER — Encounter (HOSPITAL_COMMUNITY): Payer: Self-pay | Admitting: *Deleted

## 2024-03-08 ENCOUNTER — Other Ambulatory Visit: Payer: Self-pay

## 2024-03-08 ENCOUNTER — Emergency Department (HOSPITAL_COMMUNITY)
Admission: EM | Admit: 2024-03-08 | Discharge: 2024-03-08 | Disposition: A | Discharge status: Home / Self Care | Attending: Emergency Medicine | Admitting: Emergency Medicine

## 2024-03-08 DIAGNOSIS — Z7982 Long term (current) use of aspirin: Secondary | ICD-10-CM | POA: Insufficient documentation

## 2024-03-08 DIAGNOSIS — R296 Repeated falls: Secondary | ICD-10-CM | POA: Insufficient documentation

## 2024-03-08 DIAGNOSIS — Z7902 Long term (current) use of antithrombotics/antiplatelets: Secondary | ICD-10-CM | POA: Insufficient documentation

## 2024-03-08 DIAGNOSIS — M79601 Pain in right arm: Secondary | ICD-10-CM | POA: Diagnosis not present

## 2024-03-08 LAB — CBC WITH DIFFERENTIAL/PLATELET
Abs Immature Granulocytes: 0.02 K/uL (ref 0.00–0.07)
Basophils Absolute: 0 K/uL (ref 0.0–0.1)
Basophils Relative: 1 %
Eosinophils Absolute: 0.6 K/uL — ABNORMAL HIGH (ref 0.0–0.5)
Eosinophils Relative: 8 %
HCT: 47.3 % (ref 39.0–52.0)
Hemoglobin: 15.5 g/dL (ref 13.0–17.0)
Immature Granulocytes: 0 %
Lymphocytes Relative: 26 %
Lymphs Abs: 1.8 K/uL (ref 0.7–4.0)
MCH: 28.7 pg (ref 26.0–34.0)
MCHC: 32.8 g/dL (ref 30.0–36.0)
MCV: 87.6 fL (ref 80.0–100.0)
Monocytes Absolute: 0.5 K/uL (ref 0.1–1.0)
Monocytes Relative: 7 %
Neutro Abs: 4.1 K/uL (ref 1.7–7.7)
Neutrophils Relative %: 58 %
Platelets: 215 K/uL (ref 150–400)
RBC: 5.4 MIL/uL (ref 4.22–5.81)
RDW: 12.9 % (ref 11.5–15.5)
WBC: 7 K/uL (ref 4.0–10.5)
nRBC: 0 % (ref 0.0–0.2)

## 2024-03-08 LAB — URINALYSIS, ROUTINE W REFLEX MICROSCOPIC
Bilirubin Urine: NEGATIVE
Glucose, UA: NEGATIVE mg/dL
Hgb urine dipstick: NEGATIVE
Ketones, ur: NEGATIVE mg/dL
Leukocytes,Ua: NEGATIVE
Nitrite: NEGATIVE
Protein, ur: NEGATIVE mg/dL
Specific Gravity, Urine: 1.008 (ref 1.005–1.030)
pH: 6 (ref 5.0–8.0)

## 2024-03-08 LAB — COMPREHENSIVE METABOLIC PANEL WITH GFR
ALT: 8 U/L (ref 0–44)
AST: 22 U/L (ref 15–41)
Albumin: 4.1 g/dL (ref 3.5–5.0)
Alkaline Phosphatase: 52 U/L (ref 38–126)
Anion gap: 11 (ref 5–15)
BUN: 9 mg/dL (ref 8–23)
CO2: 31 mmol/L (ref 22–32)
Calcium: 9.2 mg/dL (ref 8.9–10.3)
Chloride: 97 mmol/L — ABNORMAL LOW (ref 98–111)
Creatinine, Ser: 1.24 mg/dL (ref 0.61–1.24)
GFR, Estimated: >60 mL/min
Glucose, Bld: 99 mg/dL (ref 70–99)
Potassium: 3.6 mmol/L (ref 3.5–5.1)
Sodium: 138 mmol/L (ref 135–145)
Total Bilirubin: 0.4 mg/dL (ref 0.0–1.2)
Total Protein: 6.6 g/dL (ref 6.5–8.1)

## 2024-03-08 MED ORDER — ESCITALOPRAM OXALATE 10 MG PO TABS
20.0000 mg | ORAL_TABLET | Freq: Every day | ORAL | Status: DC
  Administered 2024-03-08: 20 mg via ORAL
  Filled 2024-03-08: qty 2

## 2024-03-08 MED ORDER — CHLORTHALIDONE 25 MG PO TABS
12.5000 mg | ORAL_TABLET | ORAL | Status: AC
  Administered 2024-03-08: 12.5 mg via ORAL
  Filled 2024-03-08: qty 0.5

## 2024-03-08 NOTE — ED Notes (Signed)
 Pt given food and beverage; okay per Dr. Adela Lank

## 2024-03-08 NOTE — ED Provider Triage Note (Signed)
 Emergency Medicine Provider Triage Evaluation Note  [name] [name] , a 64 y.o. male  was evaluated in triage. Off balance for about a month.   Has now fallen 6 times in 2 days and with generalized weakness. Hit head, complaints of HA, and right arm pain. No focal deficit. Walked with cane.   Review of Systems  Positive: Fall Negative: CP  Physical Exam  BP 129/77   Pulse (!) 59   Temp 98.2 F (36.8 C)   Resp 18   Ht 5' 5 (1.651 m)   Wt 63.5 kg   SpO2 99%   BMI 23.30 kg/m  Gen:   Awake, no distress   Resp:  Normal effort  MSK:   Moves extremities without difficulty  Other:    Medical Decision Making  Medically screening exam initiated at 7:43 AM.  Appropriate orders placed.  [name] [name] was informed that the remainder of the evaluation will be completed by another provider, this initial triage assessment does not replace that evaluation, and the importance of remaining in the ED until their evaluation is complete.     [name] [name], [place] 03/08/24 [id number]

## 2024-03-08 NOTE — ED Notes (Signed)
 Pt transported to MRI

## 2024-03-08 NOTE — Discharge Instructions (Signed)
 Follow up with neuro and your PCP in the office.  Your MRI looks ok.

## 2024-03-08 NOTE — ED Triage Notes (Addendum)
 Pt states he has had multiple falls over the past few days, had multiple falls yesterday and hit head and back. Pt states most recent fall was AM of 1/22 and did hit head. Pt is on Plavix . Pt family also says that he has saying things that don't make sense and confused ongoing for the past month, but not altered.

## 2024-03-08 NOTE — ED Provider Notes (Signed)
 " [place] EMERGENCY DEPARTMENT AT [facility] Provider Note   CSN: [id number] Arrival date & time: 03/08/24  0725     Patient presents with: [name] [name]   Abdo Denault is a 64 y.o. male.   64 yo M with a chief complaints of a evening where he had trouble walking.  He said a couple days ago he fell about 6 times.  He says has had no real issues today.  Denies any significant injury in the fall.  Has had some right arm pain after a vaccine some months ago.  He said his doctors made some recent medication changes that he thinks maybe are the cause of his unsteadiness.  During these events he denies a sensation that he was passing out.  He said he will be walking and just suddenly reaching for the wall and ended up on the ground.  He is on aspirin  and Plavix  for prior MI.  He denies any chest pain or difficulty breathing.  Denies any new medications.  Denies cough congestion or fever.  Denies headache or neck pain.  He tells me he feels fine and his family made him come.  He has a history of chronic back pain and radicular leg pain that he feels like has been well-managed over the years.  Denies any worsening.   Fall       Prior to Admission medications  Medication Sig Start Date End Date Taking? Authorizing Provider  aspirin  81 MG chewable tablet Chew 1 tablet (81 mg total) by mouth daily. 01/24/15   Floretta Bethanne CROME, MD  clopidogrel  (PLAVIX ) 75 MG tablet TAKE 1 TABLET BY MOUTH EVERY DAY 12/11/23   Walker, Caitlin S, NP  diltiazem  (CARDIZEM ) 30 MG tablet TAKE 1 TABLET (30 MG TOTAL) BY MOUTH 2 (TWO) TIMES DAILY AS NEEDED. 08/02/22   Lavona Agent, MD  empagliflozin (JARDIANCE) 10 MG TABS tablet Take 10 mg by mouth daily. 06/27/19   [provider]  escitalopram (LEXAPRO) 20 MG tablet Take 20 mg by mouth daily. 09/13/23   [provider]  lansoprazole (PREVACID) 30 MG capsule Take 30 mg by mouth daily.      [provider]  metFORMIN (GLUCOPHAGE-XR) 750  MG 24 hr tablet Take 750 mg by mouth in the morning and at bedtime. 08/22/22   [provider]  methocarbamol  (ROBAXIN ) 500 MG tablet Take 500 mg by mouth every 8 (eight) hours as needed for muscle spasms.     [provider]  metoprolol  (TOPROL -XL) 200 MG 24 hr tablet TAKE 1 TABLET BY MOUTH EVERY DAY 10/02/23   Walker, Caitlin S, NP  nitroGLYCERIN  (NITROSTAT ) 0.3 MG SL tablet Place 1 tablet (0.3 mg total) under the tongue every 5 (five) minutes as needed for chest pain. 11/10/23   Carlin Delon BROCKS, NP  olmesartan (BENICAR) 20 MG tablet Take 20 mg by mouth daily. 09/13/23   [provider]  rosuvastatin  (CRESTOR ) 40 MG tablet TAKE 1 TABLET BY MOUTH EVERY DAY 10/02/23   Walker, Caitlin S, NP    Allergies: Niacin    Review of Systems  Updated Vital Signs BP 120/78   Pulse (!) 57   Temp 98.2 F (36.8 C)   Resp 18   Ht 5' 5 (1.651 m)   Wt 63.5 kg   SpO2 100%   BMI 23.30 kg/m   Physical Exam Vitals and nursing note reviewed.  Constitutional:      Appearance: He is well-developed.  HENT:  Head: Normocephalic and atraumatic.  Eyes:     Pupils: Pupils are equal, round, and reactive to light.  Neck:     Vascular: No JVD.  Cardiovascular:     Rate and Rhythm: Normal rate and regular rhythm.     Heart sounds: No murmur heard.    No friction rub. No gallop.  Pulmonary:     Effort: No respiratory distress.     Breath sounds: No wheezing.  Abdominal:     General: There is no distension.     Tenderness: There is no abdominal tenderness. There is no guarding or rebound.  Musculoskeletal:        General: Normal range of motion.     Cervical back: Normal range of motion and neck supple.  Skin:    Coloration: Skin is not pale.     Findings: No rash.  Neurological:     Mental Status: He is alert and oriented to person, place, and time.     Cranial Nerves: Cranial nerves 2-12 are intact.     Sensory: Sensation is intact.     Motor: Motor function is intact.      Coordination: Coordination is intact.     Comments: Benign neurologic exam patient is able to walk independently without any obvious instability.  Psychiatric:        Behavior: Behavior normal.     (all labs ordered are listed, but only abnormal results are displayed) Labs Reviewed  COMPREHENSIVE METABOLIC PANEL WITH GFR - Abnormal; Notable for the following components:      Result Value   Chloride 97 (*)    All other components within normal limits  CBC WITH DIFFERENTIAL/PLATELET - Abnormal; Notable for the following components:   Eosinophils Absolute 0.6 (*)    All other components within normal limits  URINALYSIS, ROUTINE W REFLEX MICROSCOPIC    EKG: EKG Interpretation Date/Time:  Friday March 08 2024 08:04:56 EST Ventricular Rate:  61 PR Interval:  172 QRS Duration:  116 QT Interval:  452 QTC Calculation: 455 R Axis:   -43  Text Interpretation: Normal sinus rhythm Left axis deviation Pulmonary disease pattern Minimal voltage criteria for LVH, may be normal variant ( R in aVL ) Nonspecific T wave abnormality Abnormal ECG No significant change since last tracing Confirmed by Emil Share [phone]) on 03/08/2024 9:52:24 AM  Radiology: MR BRAIN WO CONTRAST Result Date: 03/08/2024 EXAM: MRI BRAIN WITHOUT CONTRAST 03/08/2024 03:15:07 PM TECHNIQUE: Multiplanar multisequence MRI of the head/brain was performed without the administration of intravenous contrast. COMPARISON: CT head 03/08/2024. CLINICAL HISTORY: Neuro deficit, acute, stroke suspected. FINDINGS: BRAIN AND VENTRICLES: No acute infarct. No intracranial hemorrhage. No mass. No midline shift. No hydrocephalus. The sella is unremarkable. Normal flow voids. ORBITS: No significant abnormality. SINUSES AND MASTOIDS: No significant abnormality. BONES AND SOFT TISSUES: Normal marrow signal. No soft tissue abnormality. IMPRESSION: 1. Normal brain MRI. Electronically signed by: Ryan Chess MD 03/08/2024 03:32 PM EST RP  Workstation: [id number]   CT Cervical Spine Wo Contrast Result Date: 03/08/2024 EXAM: CT CERVICAL SPINE WITHOUT CONTRAST 03/08/2024 08:34:50 AM TECHNIQUE: CT of the cervical spine was performed without the administration of intravenous contrast. Multiplanar reformatted images are provided for review. Automated exposure control, iterative reconstruction, and/or weight based adjustment of the mA/kV was utilized to reduce the radiation dose to as low as reasonably achievable. COMPARISON: CT cervical myelogram 03/25/2011. CLINICAL HISTORY: 64 year old male with multiple recent falls, neck trauma, and impaired range of motion. FINDINGS: BONES AND ALIGNMENT: Normal lordosis. 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 No acute fracture or traumatic malalignment. 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 Up to moderate superimposed intermittent cervical facet arthropathy. Mild cervical spinal stenosis suspected. SOFT TISSUES: Partially retropharyngeal course of the right carotid, normal variant. Otherwise negative visible non-contrast neck soft tissues. Negative visible non-contrast thoracic inlet. No prevertebral soft tissue swelling. IMPRESSION: 1. No acute traumatic injury identified in the cervical spine. 2. 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 Suspect mild cervical spinal stenosis. 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 Consider follow up with Spine Surgery. Electronically signed by: Helayne Hurst MD 03/08/2024 08:43 AM EST RP Workstation: [id number]   CT Head Wo Contrast Result Date: 03/08/2024 EXAM: CT HEAD WITHOUT CONTRAST 03/08/2024 08:34:50 AM TECHNIQUE: CT of the head was performed without the administration of intravenous contrast. Automated exposure control, iterative reconstruction, and/or weight based adjustment of the mA/kV was  utilized to reduce the radiation dose to as low as reasonably achievable. COMPARISON: None available. CLINICAL HISTORY: 64 year old male with multiple recent falls. On blood thinners. Moderate to severe head trauma. FINDINGS: BRAIN AND VENTRICLES: No acute hemorrhage. No evidence of acute infarct. No hydrocephalus. No extra-axial collection. No mass effect or midline shift. Normal brain volume for age. Normal gray white differentiation for age. No suspicious intracranial vascular hyperdensity. Calcified atherosclerosis at the skull base. ORBITS: No acute abnormality. SINUSES: Tympanic cavities, mastoids and paranasal sinuses are well aerated. Rounded opacity at the left maxillary alveolar recess might be a retention cyst. SOFT TISSUES AND SKULL: No acute soft tissue abnormality. No skull fracture. IMPRESSION: 1. No acute traumatic injury identified. 2. Normal for age non-contrast CT appearance of the brain. Electronically signed by: Helayne Hurst MD 03/08/2024 08:39 AM EST RP Workstation: [id number]   DG Shoulder Right Result Date: 03/08/2024 CLINICAL DATA:  Fall.  Right shoulder pain. EXAM: RIGHT SHOULDER - 2+ VIEW COMPARISON:  None Available. FINDINGS: No evidence for an acute fracture. No shoulder separation or dislocation. Mild degenerative changes noted in the glenoid and at the rotator cuff insertion. IMPRESSION: No acute findings. Electronically Signed   By: Camellia Candle M.D.   On: 03/08/2024 08:26     Procedures   Medications Ordered in the ED  escitalopram (LEXAPRO) tablet 20 mg (20 mg Oral Given 03/08/24 1415)  chlorthalidone  (HYGROTON ) tablet 12.5 mg (12.5 mg Oral Given 03/08/24 1415)                                    Medical Decision Making Amount and/or Complexity of Data Reviewed Radiology: ordered.  Risk Prescription drug management.   64 yo M with a chief complaints of multiple falls.  This happened a couple nights ago.  He feels he has been walking fine since.  He carries a  cane at times because he says it sometimes helps him with stability and sometimes is used to scare away animals when he is walking his dog.  He said that sometimes he just feels unsteady.  Not persistent.  Seems to happen at random.  He denied syncope or presyncope.  Denied headaches.  Has a reassuring neurologic exam here.  I am not sure what exactly the patient experienced a couple nights ago.  Perhaps the patient was having a syncopal event, perhaps a cardiac arrhythmia, perhaps this was a TIA.  It  seems unlikely to be a spinal lesion his symptoms are now better.  After discussion with the patient and family at bedside will obtain an MRI here to assess if that it was a stroke.  If negative patient is already on aspirin  and Plavix  I think reasonable to have him follow-up as an outpatient with neurology and cardiology.  MRI negative.  D/c home.  Neuro, pcp follow up.  3:36 PM:  I have discussed the diagnosis/risks/treatment options with the patient.  Evaluation and diagnostic testing in the emergency department does not suggest an emergent condition requiring admission or immediate intervention beyond what has been performed at this time.  They will follow up with PCP. We also discussed returning to the ED immediately if new or worsening sx occur. We discussed the sx which are most concerning (e.g., sudden worsening pain, fever, inability to tolerate by mouth) that necessitate immediate return. Medications administered to the patient during their visit and any new prescriptions provided to the patient are listed below.  Medications given during this visit Medications  escitalopram (LEXAPRO) tablet 20 mg (20 mg Oral Given 03/08/24 1415)  chlorthalidone  (HYGROTON ) tablet 12.5 mg (12.5 mg Oral Given 03/08/24 1415)     The patient appears reasonably screen and/or stabilized for discharge and I doubt any other medical condition or other [facility] requiring further screening, evaluation, or treatment in the ED  at this time prior to discharge.       Final diagnoses:  Frequent falls    ED Discharge Orders          Ordered    Ambulatory referral to Neurology       Comments: Transient gait difficulties?   03/08/24 1516               Emil Share, DO 03/08/24 1536  "
